# Patient Record
Sex: Male | Born: 1960 | Race: White | Hispanic: No | Marital: Married | Smoking: Former smoker
Health system: Southern US, Community
[De-identification: ages and names within clinical notes are randomized; demographics above are authoritative.]

## PROBLEM LIST (undated history)

## (undated) DIAGNOSIS — R0789 Other chest pain: Secondary | ICD-10-CM

## (undated) DIAGNOSIS — I493 Ventricular premature depolarization: Secondary | ICD-10-CM

## (undated) DIAGNOSIS — Z8661 Personal history of infections of the central nervous system: Secondary | ICD-10-CM

## (undated) DIAGNOSIS — J329 Chronic sinusitis, unspecified: Secondary | ICD-10-CM

## (undated) DIAGNOSIS — E785 Hyperlipidemia, unspecified: Secondary | ICD-10-CM

## (undated) HISTORY — DX: Ventricular premature depolarization: I49.3

## (undated) HISTORY — DX: Hyperlipidemia, unspecified: E78.5

## (undated) HISTORY — DX: Other chest pain: R07.89

## (undated) HISTORY — DX: Chronic sinusitis, unspecified: J32.9

## (undated) HISTORY — DX: Personal history of infections of the central nervous system: Z86.61

---

## 2004-03-13 ENCOUNTER — Ambulatory Visit: Payer: Self-pay | Admitting: Otolaryngology

## 2005-09-16 ENCOUNTER — Observation Stay: Payer: Self-pay | Admitting: Internal Medicine

## 2005-10-02 ENCOUNTER — Ambulatory Visit: Payer: Self-pay | Admitting: Internal Medicine

## 2005-10-10 ENCOUNTER — Ambulatory Visit: Payer: Self-pay

## 2005-10-16 ENCOUNTER — Ambulatory Visit (HOSPITAL_COMMUNITY): Admission: RE | Admit: 2005-10-16 | Discharge: 2005-10-16 | Payer: Self-pay | Admitting: Cardiovascular Disease

## 2005-10-16 ENCOUNTER — Ambulatory Visit: Payer: Self-pay | Admitting: Cardiovascular Disease

## 2005-11-17 ENCOUNTER — Ambulatory Visit: Payer: Self-pay | Admitting: Internal Medicine

## 2008-05-10 ENCOUNTER — Ambulatory Visit: Payer: Self-pay | Admitting: Gastroenterology

## 2010-01-06 HISTORY — PX: NASAL SINUS SURGERY: SHX719

## 2010-01-26 ENCOUNTER — Encounter: Payer: Self-pay | Admitting: Cardiovascular Disease

## 2010-10-07 ENCOUNTER — Ambulatory Visit (INDEPENDENT_AMBULATORY_CARE_PROVIDER_SITE_OTHER): Payer: BC Managed Care – PPO | Admitting: Internal Medicine

## 2010-10-07 ENCOUNTER — Encounter: Payer: Self-pay | Admitting: Internal Medicine

## 2010-10-07 VITALS — BP 131/88 | HR 75 | Ht 76.0 in | Wt 262.0 lb

## 2010-10-07 DIAGNOSIS — I499 Cardiac arrhythmia, unspecified: Secondary | ICD-10-CM

## 2010-10-07 DIAGNOSIS — I493 Ventricular premature depolarization: Secondary | ICD-10-CM | POA: Insufficient documentation

## 2010-10-07 MED ORDER — VERAPAMIL HCL 40 MG PO TABS: ORAL_TABLET | ORAL | Status: DC

## 2010-10-07 NOTE — Patient Instructions (Addendum)
START Verapamil 40mg  every 6-8 hrs AS NEEDED.  Your physician has requested that you have an echocardiogram. Echocardiography is a painless test that uses sound waves to create images of your heart. It provides your doctor with information about the size and shape of your heart and how well your heart's chambers and valves are working. This procedure takes approximately one hour. There are no restrictions for this procedure.  Your physician recommends that you schedule a follow-up appointment in: 1 year

## 2010-10-07 NOTE — Progress Notes (Signed)
HPI Michael Fletcher is a 50 y.o. 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 I have not seen him since 2007. At that time he had undergone an evaluation to exclude ARVC  This included an MR which was notable for mild hypokinesis attributed to tethering from a moderator band and without infiltration; electrocardiogram with normal right ventricular structures and the Myoview scan done [facility] clinic w normal left ventricular perfusion.  At that time we had made recommendations to discontinue soda and caffeine which is associated with a 99+ percent a limitation of his ectopic beats. He has recently had some recurrences. They typically come in short clusters lasting 5-10 minutes. They have not been associated with exercise. There has been no lightheadedness or syncope. Current Outpatient Prescriptions  Medication Sig Dispense Refill  . Melatonin 5 MG TABS Take by mouth.        . naproxen sodium (ANAPROX) 220 MG tablet Take 220 mg by mouth 2 (two) times daily with a meal.          Allergies  Allergen Reactions  . Floxacillin Base   . Statins     Makes him feel crazy.  [name]     Past Medical History  Diagnosis Date  . Hyperlipidemia   . Arrhythmia     Past Surgical History  Procedure Date  . Nasal sinus surgery 01/2010    History reviewed. No pertinent family history.  History   Social History  . Marital Status: Single    Spouse Name: N/A    Number of Children: N/A  . Years of Education: N/A   Occupational History  . Not on file.   Social History Main Topics  . Smoking status: Former Smoker -- 0.0 packs/day for 0 years    Types: Cigarettes  . Smokeless tobacco: Not on file  . Alcohol Use: No  . Drug Use: No  . Sexually Active: Not on file   Other Topics Concern  . Not on file   Social History Narrative  . No narrative on file    Fourteen point review of systems was negative except as noted in HPI and PMH    PHYSICAL EXAMINATION  Blood pressure 131/88, pulse 75, height 6\' 4"  (1.93 m), weight 262 lb (118.842 kg).   Well developed and nourished in no acute distress HENT normal Neck supple with JVP-flat Carotids brisk and full without bruits Back without scoliosis or kyphosis Clear Regular rate and rhythm, no murmurs or gallops Abd-soft with active BS without hepatomegaly or midline pulsation Femoral pulses 2+ distal pulses intact No Clubbing cyanosis edema Skin-warm and dry LN-neg submandibular and supraclavicular A & Oriented CN 3-12 normal  Grossly normal sensory and motor function Affect engaging  Electrocardiogram dated today demonstrates sinus rhythm at 67  intervals 0.18/0.08/0.37 Axis is 34 T Wave morphologies V2-V4 were normal.

## 2010-10-07 NOTE — Assessment & Plan Note (Signed)
The patient is having some recurrences of his ventricular ectopy. We will re\re prescribe verapamil to be taken on an as-needed basis. Given his slight abnormalities noted on his MRI 5 years ago, we will repeat an echo looking at right ventricular structures, specifically sizes

## 2010-10-22 ENCOUNTER — Other Ambulatory Visit (INDEPENDENT_AMBULATORY_CARE_PROVIDER_SITE_OTHER): Payer: BC Managed Care – PPO | Admitting: *Deleted

## 2010-10-22 DIAGNOSIS — I379 Nonrheumatic pulmonary valve disorder, unspecified: Secondary | ICD-10-CM

## 2010-10-22 DIAGNOSIS — I499 Cardiac arrhythmia, unspecified: Secondary | ICD-10-CM

## 2010-10-22 DIAGNOSIS — R002 Palpitations: Secondary | ICD-10-CM

## 2012-09-13 ENCOUNTER — Ambulatory Visit: Payer: BC Managed Care – PPO | Admitting: Internal Medicine

## 2012-09-22 ENCOUNTER — Encounter: Payer: Self-pay | Admitting: Internal Medicine

## 2012-09-22 ENCOUNTER — Ambulatory Visit (INDEPENDENT_AMBULATORY_CARE_PROVIDER_SITE_OTHER): Payer: BC Managed Care – PPO | Admitting: Internal Medicine

## 2012-09-22 VITALS — BP 120/84 | HR 69 | Ht 76.0 in | Wt 273.5 lb

## 2012-09-22 DIAGNOSIS — I493 Ventricular premature depolarization: Secondary | ICD-10-CM

## 2012-09-22 DIAGNOSIS — I499 Cardiac arrhythmia, unspecified: Secondary | ICD-10-CM

## 2012-09-22 DIAGNOSIS — I4949 Other premature depolarization: Secondary | ICD-10-CM

## 2012-09-22 MED ORDER — PROPRANOLOL HCL 10 MG PO TABS: 10.0000 mg | ORAL_TABLET | ORAL | Status: DC | PRN

## 2012-09-22 NOTE — Patient Instructions (Addendum)
Your physician has recommended you make the following change in your medication:  1) Stop Verapamil 2) Start Propanolol 10 mg as needed  Your physician wants you to follow-up in: one year with Dr. Graciela Husbands. You will receive a reminder letter in the mail two months in advance. If you don't receive a letter, please call our office to schedule the follow-up appointment.

## 2012-09-22 NOTE — Progress Notes (Signed)
Patient Care Team: Provider Not In System as PCP - General   HPI  Michael Fletcher is a 52 y.o. male Seen in followup for PVCs with RVOT morphology. He was seen initially in 2007. I saw him again in 2012. MRI 2007 showed mild hypokinesis. Echocardiogram demonstrated normal right-sided structures 2012  We have no documentation in the current computer systems of his PVC morphology.  He recently had an episode where they were more frequent. This may have been related to stress. He notes an association between PVCs and caffeine; they seemed to be suppressed with exercise  Past Medical History  Diagnosis Date  . Hyperlipidemia   . Arrhythmia     Past Surgical History  Procedure Laterality Date  . Nasal sinus surgery  01/2010    Current Outpatient Prescriptions  Medication Sig Dispense Refill  . verapamil (CALAN) 40 MG tablet 1 tablet every 6-8 hours PRN  30 tablet  3   No current facility-administered medications for this visit.    Allergies  Allergen Reactions  . Floxacillin Base   . Statins     Makes him feel crazy.  . Sulfur     Review of Systems negative except from HPI and PMH  Physical Exam BP 120/84  Pulse 69  Ht 6\' 4"  (1.93 m)  Wt 273 lb 8 oz (124.059 kg)  BMI 33.31 kg/m2 Well developed and nourished in no acute distress HENT normal Neck supple with JVP-flat Clear Regular rate and rhythm, no murmurs or gallops Abd-soft with active BS No Clubbing cyanosis edema Skin-warm and dry A & Oriented  Grossly normal sensory and motor function y ECG demonstrates sinus rhythm at 69 intervals 16/09/37 Q waves are normal in V2 and V3 Terminal activation time in V1 is long   Assessment and  Plan

## 2012-09-22 NOTE — Assessment & Plan Note (Addendum)
He continues to have some PVCs. We will change his verapamil to when necessary propranolol.  His ECG demonstrates a long TAD of about 60 ms. There are no other morphologic suggestions of arrhythmogenic cardiomyopathy  Reason studies is suggested a treadmill testing can often bring out repolarization and depolarization abnormalities; further studies have suggested electrical abnormalities precede structural abnormalities

## 2012-09-30 ENCOUNTER — Encounter: Payer: Self-pay | Admitting: *Deleted

## 2013-09-27 ENCOUNTER — Encounter: Payer: Self-pay | Admitting: Internal Medicine

## 2013-09-27 ENCOUNTER — Ambulatory Visit (INDEPENDENT_AMBULATORY_CARE_PROVIDER_SITE_OTHER): Payer: BC Managed Care – PPO | Admitting: Internal Medicine

## 2013-09-27 VITALS — BP 126/78 | HR 60 | Ht 76.0 in | Wt 273.0 lb

## 2013-09-27 DIAGNOSIS — I4949 Other premature depolarization: Secondary | ICD-10-CM

## 2013-09-27 DIAGNOSIS — I493 Ventricular premature depolarization: Secondary | ICD-10-CM

## 2013-09-27 NOTE — Progress Notes (Signed)
Patient Care Team: Titus Mould, NP as PCP - General (Family Medicine)   HPI  Michael Fletcher is a 53 y.o. male Seen in followup for PVCs with RVOT morphology. He was seen initially in 2007. I saw him again in 2012. MRI 2007 showed mild hypokinesis. Echocardiogram demonstrated normal right-sided structures 2012  We have no documentation in the current computer systems of his PVC morphology.  He has had no intercurrent palpitations. He has no problems with exercise tolerance.     Past Medical History  Diagnosis Date  . Hyperlipidemia   . Arrhythmia   . Chronic sinusitis   . Arrhythmia   . Hx: meningitis     Past Surgical History  Procedure Laterality Date  . Nasal sinus surgery  01/2010    Current Outpatient Prescriptions  Medication Sig Dispense Refill  . fluticasone (FLONASE) 50 MCG/ACT nasal spray 2 sprays in each nostril twice a day      . propranolol (INDERAL) 10 MG tablet Take 1 tablet (10 mg total) by mouth as needed.  30 tablet  3   No current facility-administered medications for this visit.    Allergies  Allergen Reactions  . Floxacillin Base   . Statins     Makes him feel crazy.  . 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 The patient is clinically stable. We will see him in 2 years. We will get an echocardiogram at that time.  Largely  quiescent. We will see him again in 2 year's time

## 2013-09-27 NOTE — Patient Instructions (Signed)
Your physician wants you to follow-up in:  2 years. You will receive a reminder letter in the mail two months in advance. If you don't receive a letter, please call our office to schedule the follow-up appointment.   

## 2014-03-20 ENCOUNTER — Emergency Department: Payer: Self-pay | Admitting: Emergency Medicine

## 2015-04-16 ENCOUNTER — Telehealth: Payer: Self-pay | Admitting: Internal Medicine

## 2015-04-16 MED ORDER — PROPRANOLOL HCL 10 MG PO TABS: 10.0000 mg | ORAL_TABLET | ORAL | Status: DC | PRN

## 2015-04-16 NOTE — Telephone Encounter (Signed)
I called and spoke with the patient. He states that over the last 1-2 weeks, he has been having an increase in heart palpitations/ heart skipping. He has been out of his PRN propranolol for about the last 2 months. He denies any other related symptoms to his heart palpitations.  A refill for his propranolol will be sent in. I have advised the patient to start back on propranolol 10 mg once daily as needed. He will call back in a week to let us know how he is doing. If no improvement in symptoms, I advised him he may need to wear a holter monitor, but will review with MD at that time. He is agreeable.

## 2015-04-16 NOTE — Telephone Encounter (Signed)
Pt calling stating he is having irregular heart beat for about now a week or 2 No other symptoms.  Please advise   *STAT* If patient is at the pharmacy, call can be transferred to refill team.   1. Which medications need to be refilled? (please list name of each medication and dose if known) Propranolol    2. Which pharmacy/location (including street and city if local pharmacy) is medication to be sent to? [facility] in [place]   3. Do they need a 30 day or 90 day supply? 90 day

## 2015-07-11 ENCOUNTER — Emergency Department
Admission: EM | Admit: 2015-07-11 | Discharge: 2015-07-11 | Disposition: A | Discharge status: Home / Self Care | Payer: BLUE CROSS/BLUE SHIELD | Attending: Emergency Medicine | Admitting: Emergency Medicine

## 2015-07-11 ENCOUNTER — Emergency Department: Payer: BLUE CROSS/BLUE SHIELD

## 2015-07-11 ENCOUNTER — Encounter: Payer: Self-pay | Admitting: *Deleted

## 2015-07-11 DIAGNOSIS — Z79899 Other long term (current) drug therapy: Secondary | ICD-10-CM | POA: Insufficient documentation

## 2015-07-11 DIAGNOSIS — R0789 Other chest pain: Secondary | ICD-10-CM | POA: Insufficient documentation

## 2015-07-11 DIAGNOSIS — Z87891 Personal history of nicotine dependence: Secondary | ICD-10-CM | POA: Diagnosis not present

## 2015-07-11 DIAGNOSIS — E785 Hyperlipidemia, unspecified: Secondary | ICD-10-CM | POA: Diagnosis not present

## 2015-07-11 DIAGNOSIS — Z7951 Long term (current) use of inhaled steroids: Secondary | ICD-10-CM | POA: Insufficient documentation

## 2015-07-11 DIAGNOSIS — R079 Chest pain, unspecified: Secondary | ICD-10-CM

## 2015-07-11 LAB — CBC
HEMATOCRIT: 44.8 % (ref 40.0–52.0)
HEMOGLOBIN: 15.6 g/dL (ref 13.0–18.0)
MCH: 31.6 pg (ref 26.0–34.0)
MCHC: 34.7 g/dL (ref 32.0–36.0)
MCV: 90.9 fL (ref 80.0–100.0)
Platelets: 245 10*3/uL (ref 150–440)
RBC: 4.93 MIL/uL (ref 4.40–5.90)
RDW: 13.5 % (ref 11.5–14.5)
WBC: 10.8 10*3/uL — AB (ref 3.8–10.6)

## 2015-07-11 LAB — BASIC METABOLIC PANEL
Anion gap: 8 (ref 5–15)
BUN: 15 mg/dL (ref 6–20)
CHLORIDE: 104 mmol/L (ref 101–111)
CO2: 26 mmol/L (ref 22–32)
Calcium: 9.4 mg/dL (ref 8.9–10.3)
Creatinine, Ser: 1.03 mg/dL (ref 0.61–1.24)
GFR calc non Af Amer: >60 mL/min (ref >60)
Glucose, Bld: 80 mg/dL (ref 65–99)
POTASSIUM: 4 mmol/L (ref 3.5–5.1)
SODIUM: 138 mmol/L (ref 135–145)

## 2015-07-11 LAB — TROPONIN I
Troponin I: <0.03 ng/mL (ref <0.03)
Troponin I: <0.03 ng/mL (ref <0.03)

## 2015-07-11 MED ORDER — ASPIRIN 81 MG PO CHEW
324.0000 mg | CHEWABLE_TABLET | Freq: Once | ORAL | Status: AC
  Administered 2015-07-11: 324 mg via ORAL

## 2015-07-11 NOTE — ED Notes (Signed)
Pt denies chest pain at time of assessment pt reports tingling to left arm

## 2015-07-11 NOTE — Discharge Instructions (Signed)
Nonspecific Chest Pain  °Chest pain can be caused by many different conditions. There is always a chance that your pain could be related to something serious, such as a heart attack or a blood clot in your lungs. Chest pain can also be caused by conditions that are not life-threatening. If you have chest pain, it is very important to follow up with your health care provider. °CAUSES  °Chest pain can be caused by: °· Heartburn. °· Pneumonia or bronchitis. °· Anxiety or stress. °· Inflammation around your heart (pericarditis) or lung (pleuritis or pleurisy). °· A blood clot in your lung. °· A collapsed lung (pneumothorax). It can develop suddenly on its own (spontaneous pneumothorax) or from trauma to the chest. °· Shingles infection (varicella-zoster virus). °· Heart attack. °· Damage to the bones, muscles, and cartilage that make up your chest wall. This can include: °¨ Bruised bones due to injury. °¨ Strained muscles or cartilage due to frequent or repeated coughing or overwork. °¨ Fracture to one or more ribs. °¨ Sore cartilage due to inflammation (costochondritis). °RISK FACTORS  °Risk factors for chest pain may include: °· Activities that increase your risk for trauma or injury to your chest. °· Respiratory infections or conditions that cause frequent coughing. °· Medical conditions or overeating that can cause heartburn. °· Heart disease or family history of heart disease. °· Conditions or health behaviors that increase your risk of developing a blood clot. °· Having had chicken pox (varicella zoster). °SIGNS AND SYMPTOMS °Chest pain can feel like: °· Burning or tingling on the surface of your chest or deep in your chest. °· Crushing, pressure, aching, or squeezing pain. °· Dull or sharp pain that is worse when you move, cough, or take a deep breath. °· Pain that is also felt in your back, neck, shoulder, or arm, or pain that spreads to any of these areas. °Your chest pain may come and go, or it may stay  constant. °DIAGNOSIS °Lab tests or other studies may be needed to find the cause of your pain. Your health care provider may have you take a test called an ambulatory ECG (electrocardiogram). An ECG records your heartbeat patterns at the time the test is performed. You may also have other tests, such as: °· Transthoracic echocardiogram (TTE). During echocardiography, sound waves are used to create a picture of all of the heart structures and to look at how blood flows through your heart. °· Transesophageal echocardiogram (TEE). This is a more advanced imaging test that obtains images from inside your body. It allows your health care provider to see your heart in finer detail. °· Cardiac monitoring. This allows your health care provider to monitor your heart rate and rhythm in real time. °· Holter monitor. This is a portable device that records your heartbeat and can help to diagnose abnormal heartbeats. It allows your health care provider to track your heart activity for several days, if needed. °· Stress tests. These can be done through exercise or by taking medicine that makes your heart beat more quickly. °· Blood tests. °· Imaging tests. °TREATMENT  °Your treatment depends on what is causing your chest pain. Treatment may include: °· Medicines. These may include: °¨ Acid blockers for heartburn. °¨ Anti-inflammatory medicine. °¨ Pain medicine for inflammatory conditions. °¨ Antibiotic medicine, if an infection is present. °¨ Medicines to dissolve blood clots. °¨ Medicines to treat coronary artery disease. °· Supportive care for conditions that do not require medicines. This may include: °¨ Resting. °¨ Applying heat   or cold packs to injured areas. °¨ Limiting activities until pain decreases. °HOME CARE INSTRUCTIONS °· If you were prescribed an antibiotic medicine, finish it all even if you start to feel better. °· Avoid any activities that bring on chest pain. °· Do not use any tobacco products, including  cigarettes, chewing tobacco, or electronic cigarettes. If you need help quitting, ask your health care provider. °· Do not drink alcohol. °· Take medicines only as directed by your health care provider. °· Keep all follow-up visits as directed by your health care provider. This is important. This includes any further testing if your chest pain does not go away. °· If heartburn is the cause for your chest pain, you may be told to keep your head raised (elevated) while sleeping. This reduces the chance that acid will go from your stomach into your esophagus. °· Make lifestyle changes as directed by your health care provider. These may include: °¨ Getting regular exercise. Ask your health care provider to suggest some activities that are safe for you. °¨ Eating a heart-healthy diet. A registered dietitian can help you to learn healthy eating options. °¨ Maintaining a healthy weight. °¨ Managing diabetes, if necessary. °¨ Reducing stress. °SEEK MEDICAL CARE IF: °· Your chest pain does not go away after treatment. °· You have a rash with blisters on your chest. °· You have a fever. °SEEK IMMEDIATE MEDICAL CARE IF:  °· Your chest pain is worse. °· You have an increasing cough, or you cough up blood. °· You have severe abdominal pain. °· You have severe weakness. °· You faint. °· You have chills. °· You have sudden, unexplained chest discomfort. °· You have sudden, unexplained discomfort in your arms, back, neck, or jaw. °· You have shortness of breath at any time. °· You suddenly start to sweat, or your skin gets clammy. °· You feel nauseous or you vomit. °· You suddenly feel light-headed or dizzy. °· Your heart begins to beat quickly, or it feels like it is skipping beats. °These symptoms may represent a serious problem that is an emergency. Do not wait to see if the symptoms will go away. Get medical help right away. Call your local emergency services (911 in the [place] Do not drive yourself to the hospital. °  °This  information is not intended to replace advice given to you by your health care provider. Make sure you discuss any questions you have with your health care provider. °  °Document Released: 10/02/2004 Document Revised: 01/13/2014 Document Reviewed: 07/29/2013 °Elsevier Interactive Patient Education ©2016 [employer] ° °

## 2015-07-11 NOTE — ED Provider Notes (Signed)
[facility] Emergency Department Provider Note        Time seen: ----------------------------------------- 6:23 PM on 07/11/2015 -----------------------------------------    I have reviewed the triage vital signs and the nursing notes.   HISTORY  Chief Complaint Chest Pain    HPI Michael LemmingsWouter Fletcher is a 55 y.o. male who presents to ER for chest pain and tingling in his left arm. Patient states chest pressure on the left side of his chest, began this morning. He has never had a history of this before, denies sweats, nausea, shortness of breath. Patient reports she does have a history of arrhythmia. He states he does have a history of high cholesterol but cannot tolerate statins.   Past Medical History  Diagnosis Date  . Hyperlipidemia   . Arrhythmia   . Chronic sinusitis   . Arrhythmia   . Hx: meningitis     Patient Active Problem List   Diagnosis Date Noted  . 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 Nasal sinus surgery  01/2010    Allergies Floxacillin base; Statins; and Sulfur  Social History Social History  Substance Use Topics  . Smoking status: Former Smoker -- 0.00 packs/day for 0 years    Types: Cigarettes  . Smokeless tobacco: None  . Alcohol Use: Yes     Comment: occassional    Review of Systems Constitutional: Negative for fever. Eyes: Negative for visual changes. ENT: Negative for sore throat. Cardiovascular: Positive for chest pain Respiratory: Negative for shortness of breath. Gastrointestinal: Negative for abdominal pain, vomiting and diarrhea. Genitourinary: Negative for dysuria. Musculoskeletal: Negative for back pain. Skin: Negative for rash. Neurological: Negative for headaches, positive for tingling in the left arm  10-point ROS otherwise negative.  ____________________________________________   PHYSICAL EXAM:  VITAL SIGNS: ED Triage Vitals  Enc Vitals Group      BP 07/11/15 1658 141/86 mmHg     Pulse Rate 07/11/15 1658 57     Resp 07/11/15 1658 18     Temp 07/11/15 1658 98.5 F (36.9 C)     Temp Source 07/11/15 1658 Oral     SpO2 07/11/15 1658 96 %     Weight 07/11/15 1658 260 lb (117.935 kg)     Height 07/11/15 1658 6\' 4"  (1.93 m)     Head Cir --      Peak Flow --      Pain Score 07/11/15 1658 3     Pain Loc --      Pain Edu? --      Excl. in GC? --     Constitutional: Alert and oriented. Well appearing and in no distress. Eyes: Conjunctivae are normal. PERRL. Normal extraocular movements. ENT   Head: Normocephalic and atraumatic.   Nose: No congestion/rhinnorhea.   Mouth/Throat: Mucous membranes are moist.   Neck: No stridor. Cardiovascular: Normal rate, regular rhythm. No murmurs, rubs, or gallops. Respiratory: Normal respiratory effort without tachypnea nor retractions. Breath sounds are clear and equal bilaterally. No wheezes/rales/rhonchi. Gastrointestinal: Soft and nontender. Normal bowel sounds Musculoskeletal: Nontender with normal range of motion in all extremities. No lower extremity tenderness nor edema. Neurologic:  Normal speech and language. No gross focal neurologic deficits are appreciated.  Skin:  Skin is warm, dry and intact. No rash noted. Psychiatric: Mood and affect are normal. Speech and behavior are normal.  ____________________________________________  EKG: Interpreted by me. Sinus rhythm rate of 60 bpm, normal PR interval, normal QRS size, normal QT  interval. Normal axis. Possible anterior infarct age indeterminate  ____________________________________________  ED COURSE:  Pertinent labs & imaging results that were available during my care of the patient were reviewed by me and considered in my medical decision making (see chart for details). Patient is in no distress, very pleasant. I will check cardiac labs and imaging. ____________________________________________    LABS (pertinent  positives/negatives)  Labs Reviewed  CBC - Abnormal; Notable for the following:    WBC 10.8 (*)    All other components within normal limits  BASIC METABOLIC PANEL  TROPONIN I  TROPONIN I    RADIOLOGY  Chest x-ray Is unremarkable ____________________________________________  FINAL ASSESSMENT AND PLAN  Chest pain  Plan: Patient with labs and imaging as dictated above. Patient with negative labs and serial troponin which are unremarkable. He'll be referred for cardiology follow-up in 24 hours.   Emily FilbertWilliams, Jonathan E, MD   Note: This dictation was prepared with Dragon dictation. Any transcriptional errors that result from this process are unintentional   Emily FilbertJonathan E Williams, MD 07/11/15 [phone]

## 2015-07-11 NOTE — ED Notes (Signed)
States mid chest pressure that began this AM, states it began in his left arm and radiates to his chest, denies any SOB, states hx of an arrythemia

## 2015-07-12 ENCOUNTER — Ambulatory Visit (INDEPENDENT_AMBULATORY_CARE_PROVIDER_SITE_OTHER): Payer: BLUE CROSS/BLUE SHIELD | Admitting: Nurse Practitioner

## 2015-07-12 ENCOUNTER — Encounter: Payer: Self-pay | Admitting: Nurse Practitioner

## 2015-07-12 VITALS — BP 122/90 | HR 70 | Ht 76.0 in | Wt 267.4 lb

## 2015-07-12 DIAGNOSIS — I493 Ventricular premature depolarization: Secondary | ICD-10-CM

## 2015-07-12 DIAGNOSIS — R0789 Other chest pain: Secondary | ICD-10-CM

## 2015-07-12 DIAGNOSIS — E785 Hyperlipidemia, unspecified: Secondary | ICD-10-CM | POA: Insufficient documentation

## 2015-07-12 NOTE — Patient Instructions (Addendum)
Medication Instructions:  Please continue your current medications  Labwork: None  Testing/Procedures: [facility]  Your caregiver has ordered a Stress Test with nuclear imaging. The purpose of this test is to evaluate the blood supply to your heart muscle. This procedure is referred to as a "Non-Invasive Stress Test." This is because other than having an IV started in your vein, nothing is inserted or "invades" your body. Cardiac stress tests are done to find areas of poor blood flow to the heart by determining the extent of coronary artery disease (CAD). Some patients exercise on a treadmill, which naturally increases the blood flow to your heart, while others who are  unable to walk on a treadmill due to physical limitations have a pharmacologic/chemical stress agent called Lexiscan . This medicine will mimic walking on a treadmill by temporarily increasing your coronary blood flow.   Please note: these test may take anywhere between 2-4 hours to complete  PLEASE REPORT TO [facility]TR[facility]  THE VOLUNTEERS AT THE FIRST DESK WILL DIRECT YOU WHERE TO GO  Date of Procedure:_____Monday, July 10__________  Arrival Time for Procedure:_____7:45 am______________  Instructions regarding medication:   __X__:  Hold PROPRANOLOL the night before and morning of procedure    Do not eat or drink after midnight  No caffeine for 24 hours prior to test  No smoking 24 hours prior to test.  Your medication may be taken with water.  If your doctor stopped a medication because of this test, do not take that medication.  Ladies, please do not wear dresses.  Skirts or pants are appropriate. Please wear a short sleeve shirt.  No perfume, cologne or lotion.  Wear comfortable walking shoes. No heels!  Please stop at the front desk and move up your ECHO appt so a sooner time  Date & time:________________________   Follow-Up: Please keep your scheduled appt w/ Dr. Graciela HusbandsKlein  If you need a  refill on your cardiac medications before your next appointment, please call your pharmacy.  Cardiac Nuclear Scanning A cardiac nuclear scan is used to check your heart for problems, such as the following:  A portion of the heart is not getting enough blood.  Part of the heart muscle has died, which happens with a heart attack.  The heart wall is not working normally.  In this test, a radioactive dye (tracer) is injected into your bloodstream. After the tracer has traveled to your heart, a scanning device is used to measure how much of the tracer is absorbed by or distributed to various areas of your heart. LET [facility] PROVIDER KNOW ABOUT:  Any allergies you have.  All medicines you are taking, including vitamins, herbs, eye drops, creams, and over-the-counter medicines.  Previous problems you or members of your family have had with the use of anesthetics.  Any blood disorders you have.  Previous surgeries you have had.  Medical conditions you have.  RISKS AND COMPLICATIONS Generally, this is a safe procedure. However, as with any procedure, problems can occur. Possible problems include:   Serious chest pain.  Rapid heartbeat.  Sensation of warmth in your chest. This usually passes quickly. BEFORE THE PROCEDURE Ask your health care provider about changing or stopping your regular medicines. PROCEDURE This procedure is usually done at a hospital and takes 2-4 hours.  An IV tube is inserted into one of your veins.  Your health care provider will inject a small amount of radioactive tracer through the tube.  You will  then wait for 20-40 minutes while the tracer travels through your bloodstream.  You will lie down on an exam table so images of your heart can be taken. Images will be taken for about 15-20 minutes.  You will exercise on a treadmill or stationary bike. While you exercise, your heart activity will be monitored with an electrocardiogram (ECG), and your  blood pressure will be checked.  If you are unable to exercise, you may be given a medicine to make your heart beat faster.  When blood flow to your heart has peaked, tracer will again be injected through the IV tube.  After 20-40 minutes, you will get back on the exam table and have more images taken of your heart.  When the procedure is over, your IV tube will be removed. AFTER THE PROCEDURE  You will likely be able to leave shortly after the test. Unless your health care provider tells you otherwise, you may return to your normal schedule, including diet, activities, and medicines.  Make sure you find out how and when you will get your test results.   This information is not intended to replace advice given to you by your health care provider. Make sure you discuss any questions you have with your health care provider.   Document Released: 01/18/2004 Document Revised: 12/28/2012 Document Reviewed: 12/01/2012 Elsevier Interactive Patient Education [employer] Echocardiogram An echocardiogram, or echocardiography, uses sound waves (ultrasound) to produce an image of your heart. The echocardiogram is simple, painless, obtained within a short period of time, and offers valuable information to your health care provider. The images from an echocardiogram can provide information such as:  Evidence of coronary artery disease (CAD).  Heart size.  Heart muscle function.  Heart valve function.  Aneurysm detection.  Evidence of a past heart attack.  Fluid buildup around the heart.  Heart muscle thickening.  Assess heart valve function. LET [facility] PROVIDER KNOW ABOUT:  Any allergies you have.  All medicines you are taking, including vitamins, herbs, eye drops, creams, and over-the-counter medicines.  Previous problems you or members of your family have had with the use of anesthetics.  Any blood disorders you have.  Previous surgeries you have had.  Medical  conditions you have.  Possibility of pregnancy, if this applies. BEFORE THE PROCEDURE  No special preparation is needed. Eat and drink normally.  PROCEDURE   In order to produce an image of your heart, gel will be applied to your chest and a wand-like tool (transducer) will be moved over your chest. The gel will help transmit the sound waves from the transducer. The sound waves will harmlessly bounce off your heart to allow the heart images to be captured in real-time motion. These images will then be recorded.  You may need an IV to receive a medicine that improves the quality of the pictures. AFTER THE PROCEDURE You may return to your normal schedule including diet, activities, and medicines, unless your health care provider tells you otherwise.   This information is not intended to replace advice given to you by your health care provider. Make sure you discuss any questions you have with your health care provider.   Document Released: 12/21/1999 Document Revised: 01/13/2014 Document Reviewed: 08/30/2012 Elsevier Interactive Patient Education [employer] Elsevier Inc.

## 2015-07-12 NOTE — Progress Notes (Signed)
Office Visit    Patient Name: Michael NewtonWouter De Ligt Date of Encounter: 07/12/2015  Primary Care Provider:  WHITE, Arlyss RepressELIZABETH BURNEY, NP Primary Cardiologist:  Odessa FlemingS. 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  Past Medical History    Past Medical History  Diagnosis Date  . Hyperlipidemia   . Chronic sinusitis   . PVC's (premature ventricular contractions)     a. RVOT Morphology;  b. 10/2005 cMRI: EF 60%, mild basal and mid RV wall HK - possibly 2/2 tethering from prominent moderator band, no fatty infiltration making RV dysplasia less likely, nl atrial sizes;  c. 10/2010 Echo: EF 55-60%, no rwma, midlly dil Ao root.  Marland Kitchen. Hx: meningitis   . Atypical chest pain     a. 07/2015 - ED visit.   Past Surgical History  Procedure Laterality Date  . Nasal sinus surgery  01/2010    Allergies  Allergies  Allergen Reactions  . Floxacillin Base   . Statins     Makes him feel crazy.  Marland Kitchen. 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 He has previously been followed by Dr. Graciela HusbandsKlein. He has normal LV function by echocardiogram in 2012.  Also identified he was also evaluated in 2007 with cardiac MRI and stress testing, which was normal. He was last seen in clinic in 2015. Since then, he has done well. He works out frequently, sometimes twice a day. He is not particularly bothered by PVCs. He was in his usual state of health until July 5, when he was sitting at his desk and noted tingling of his left arm. This was less a sensation of pins and needles and more a sensation of shakiness, described as "when you drink too much coffee." At some point later in the day, he also noted left-sided chest pressure, prompting him present to the ED. There, ECG was nonacute and troponins were normal. Symptoms resolved after taking aspirin. He was advised to follow-up with cardiology.  He has had no recurrence of symptoms.    Prior to Admission medications   Medication Sig Start Date End Date Taking? Authorizing Provider  fluticasone (FLONASE) 50 MCG/ACT nasal spray 2 sprays in each nostril twice a day 11/15/09  Yes Historical Provider, MD  propranolol (INDERAL) 10 MG tablet Take 1 tablet (10 mg total) by mouth as needed. 04/16/15  Yes Duke SalviaSteven C Klein, MD    Review of Systems   Left arm tingling and left-sided chest pressure occurring yesterday. Otherwise all other systems reviewed and are otherwise negative except as noted above.  Physical Exam    VS:  BP 122/90 mmHg  Pulse 70  Ht 6\' 4"  (1.93 m)  Wt 267 lb 6.4 oz (121.292 kg)  BMI 32.56 kg/m2 , BMI Body mass index is 32.56 kg/(m^2). GEN: Well nourished, well developed, in no acute distress. HEENT: normal. Neck: Supple, no JVD, carotid bruits, or masses. Cardiac: RRR, no murmurs, rubs, or gallops. No clubbing, cyanosis, edema.  Radials/DP/PT 2+ and equal bilaterally.  Respiratory:  Respirations regular and unlabored, clear to auscultation bilaterally. GI: Soft, nontender, nondistended, BS + x 4. MS: no deformity or atrophy. Skin: warm and dry, no rash. Neuro:  Strength and sensation are intact. Psych: Normal affect.  Accessory Clinical Findings    ECG - from  ER on July 5, sinus rhythm, 60, leftward axis, no acute ST or T changes.   Assessment & Plan   1. Left-sided chest pressure: Patient experienced left arm tingling for much of the day on July 5 and this was followed by left-sided chest pressure. He presented to the ED. ECG was nonacute. Troponins were normal. Symptoms resolved after aspirin. Symptoms are somewhat atypical, lasting for several hours without objective evidence of ischemia. I will arrange for an exercise Myoview to rule out ischemia. He is agreeable to this plan.  2. History of RVOT PVCs: He is scheduled to follow-up with Dr. Graciela HusbandsKlein in September. I will arrange for an echocardiogram as this was  planned to be done this year. He has been doing well from the standpoint of PVCs and has not required propranolol often.  3. Disposition: Follow-up Myoview. Follow-up echo with history of PVCs. Follow with Dr. Graciela HusbandsKlein as scheduled in September or sooner pending testing.  Nicolasa Duckinghristopher Sheryl Saintil, NP 07/12/2015, 9:44 AM

## 2015-07-16 ENCOUNTER — Encounter
Admission: RE | Admit: 2015-07-16 | Discharge: 2015-07-16 | Disposition: A | Discharge status: Home / Self Care | Payer: BLUE CROSS/BLUE SHIELD | Source: Ambulatory Visit | Attending: Nurse Practitioner | Admitting: Nurse Practitioner

## 2015-07-16 DIAGNOSIS — R0789 Other chest pain: Secondary | ICD-10-CM | POA: Diagnosis not present

## 2015-07-16 DIAGNOSIS — I493 Ventricular premature depolarization: Secondary | ICD-10-CM | POA: Diagnosis present

## 2015-07-16 LAB — NM MYOCAR MULTI W/SPECT W/WALL MOTION / EF
CHL CUP MPHR: 166 {beats}/min
CHL CUP NUCLEAR SDS: 0
CHL CUP NUCLEAR SRS: 0
CHL CUP RESTING HR STRESS: 74 {beats}/min
CSEPED: 10 min
CSEPEDS: 1 s
Estimated workload: 10.1 METS
LV sys vol: 34 mL
LVDIAVOL: 98 mL (ref 62–150)
Peak HR: 146 {beats}/min
Percent HR: 87 %
SSS: 0
TID: 0.96

## 2015-07-16 MED ORDER — TECHNETIUM TC 99M TETROFOSMIN IV KIT
30.0000 | PACK | Freq: Once | INTRAVENOUS | Status: AC | PRN
  Administered 2015-07-16: 32.471 via INTRAVENOUS

## 2015-07-16 MED ORDER — TECHNETIUM TC 99M TETROFOSMIN IV KIT
13.0100 | PACK | Freq: Once | INTRAVENOUS | Status: AC | PRN
  Administered 2015-07-16: 13.01 via INTRAVENOUS

## 2015-07-17 ENCOUNTER — Other Ambulatory Visit: Payer: Self-pay | Admitting: Nurse Practitioner

## 2015-07-17 DIAGNOSIS — R079 Chest pain, unspecified: Secondary | ICD-10-CM

## 2015-07-25 ENCOUNTER — Other Ambulatory Visit: Payer: BLUE CROSS/BLUE SHIELD

## 2015-07-31 ENCOUNTER — Other Ambulatory Visit: Payer: Self-pay

## 2015-07-31 ENCOUNTER — Ambulatory Visit (INDEPENDENT_AMBULATORY_CARE_PROVIDER_SITE_OTHER): Payer: BLUE CROSS/BLUE SHIELD

## 2015-07-31 DIAGNOSIS — R079 Chest pain, unspecified: Secondary | ICD-10-CM

## 2015-09-20 ENCOUNTER — Other Ambulatory Visit: Payer: Self-pay

## 2015-09-27 ENCOUNTER — Encounter: Payer: Self-pay | Admitting: Internal Medicine

## 2015-09-27 ENCOUNTER — Ambulatory Visit (INDEPENDENT_AMBULATORY_CARE_PROVIDER_SITE_OTHER): Payer: BLUE CROSS/BLUE SHIELD | Admitting: Internal Medicine

## 2015-09-27 VITALS — BP 118/72 | HR 68 | Ht 76.0 in | Wt 269.5 lb

## 2015-09-27 DIAGNOSIS — I493 Ventricular premature depolarization: Secondary | ICD-10-CM | POA: Diagnosis not present

## 2015-09-27 DIAGNOSIS — I429 Cardiomyopathy, unspecified: Secondary | ICD-10-CM | POA: Diagnosis not present

## 2015-09-27 NOTE — Progress Notes (Signed)
Patient Care Team: Titus MouldElizabeth Burney White, NP as PCP - General (Family Medicine)   HPI  Michael Fletcher is a 55 y.o. male Seen in followup for PVCs with RVOT morphology. He was seen initially in 2007. I saw him again in 2012. MRI 2007 showed mild hypokinesis. Echocardiogram demonstrated normal right-sided structures 2012   We have no documentation in the current computer systems of his PVC morphology.  He has had few intercurrent palpitations. He has no problems with exercise tolerance.  He has had some problems with lightheadedness particularly with squatting and bending over. This is been worse on hot days outside.  Seen in ER 7/17 for chest pain and was submitted for  7/17 stress myoview-normal capacity and no ischemia;   7/17 Echo  Normal LV function and mild LVH    Past Medical History:  Diagnosis Date  . Atypical chest pain    a. 07/2015 - ED visit.  . Chronic sinusitis   . Hx: meningitis   . Hyperlipidemia   . 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 10/2005 cMRI: EF 60%, mild basal and mid RV wall HK - possibly 2/2 tethering from prominent moderator band, no fatty infiltration making RV dysplasia less likely, nl atrial sizes;  c. 10/2010 Echo: EF 55-60%, no rwma, midlly dil Ao root.    Past Surgical History:  Procedure Laterality Date  . NASAL SINUS SURGERY  01/2010    Current Outpatient Prescriptions  Medication Sig Dispense Refill  . fluticasone (FLONASE) 50 MCG/ACT nasal spray 2 sprays in each nostril twice a day    . propranolol (INDERAL) 10 MG tablet Take 1 tablet (10 mg total) by mouth as needed. 90 tablet 0  . tamsulosin (FLOMAX) 0.4 MG CAPS capsule TK ONE C PO QD 1/2 HOUR AFTER A MEAL  2   No current facility-administered medications for this visit.     Allergies  Allergen Reactions  . Floxacillin Base   . Statins     Makes him feel crazy.  . 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 The patient is clinically stable. We will see him in 2 years.    PVCs quiescent  I reviewed the physiology of orthostatic intolerance, the important contribution of heat and hydration

## 2015-09-27 NOTE — Patient Instructions (Signed)
Medication Instructions: - Your physician recommends that you continue on your current medications as directed. Please refer to the Current Medication list given to you today.  Labwork: - none ordered  Procedures/Testing: - none ordered  Follow-Up: - Your physician wants you to follow-up in: 2 years with Dr. Klein. You will receive a reminder letter in the mail two months in advance. If you don't receive a letter, please call our office to schedule the follow-up appointment.   Any Additional Special Instructions Will Be Listed Below (If Applicable).     If you need a refill on your cardiac medications before your next appointment, please call your pharmacy.   

## 2016-05-01 ENCOUNTER — Other Ambulatory Visit: Payer: Self-pay | Admitting: Family Medicine

## 2016-05-01 DIAGNOSIS — R1011 Right upper quadrant pain: Secondary | ICD-10-CM

## 2016-05-07 ENCOUNTER — Ambulatory Visit
Admission: RE | Admit: 2016-05-07 | Discharge: 2016-05-07 | Disposition: A | Discharge status: Home / Self Care | Payer: BLUE CROSS/BLUE SHIELD | Source: Ambulatory Visit | Attending: Family Medicine | Admitting: Family Medicine

## 2016-05-07 DIAGNOSIS — K769 Liver disease, unspecified: Secondary | ICD-10-CM | POA: Insufficient documentation

## 2016-05-07 DIAGNOSIS — K760 Fatty (change of) liver, not elsewhere classified: Secondary | ICD-10-CM | POA: Insufficient documentation

## 2016-05-07 DIAGNOSIS — R1011 Right upper quadrant pain: Secondary | ICD-10-CM | POA: Diagnosis not present

## 2018-04-14 ENCOUNTER — Encounter: Payer: Self-pay | Admitting: *Deleted

## 2018-04-14 ENCOUNTER — Observation Stay
Admission: EM | Admit: 2018-04-14 | Discharge: 2018-04-15 | Disposition: A | Discharge status: Home / Self Care | Payer: BLUE CROSS/BLUE SHIELD | Attending: Internal Medicine | Admitting: Internal Medicine

## 2018-04-14 ENCOUNTER — Emergency Department: Payer: BLUE CROSS/BLUE SHIELD

## 2018-04-14 ENCOUNTER — Other Ambulatory Visit: Payer: Self-pay

## 2018-04-14 DIAGNOSIS — I493 Ventricular premature depolarization: Secondary | ICD-10-CM | POA: Diagnosis not present

## 2018-04-14 DIAGNOSIS — Z888 Allergy status to other drugs, medicaments and biological substances status: Secondary | ICD-10-CM | POA: Insufficient documentation

## 2018-04-14 DIAGNOSIS — Z880 Allergy status to penicillin: Secondary | ICD-10-CM | POA: Insufficient documentation

## 2018-04-14 DIAGNOSIS — E785 Hyperlipidemia, unspecified: Secondary | ICD-10-CM | POA: Diagnosis not present

## 2018-04-14 DIAGNOSIS — N400 Enlarged prostate without lower urinary tract symptoms: Secondary | ICD-10-CM | POA: Insufficient documentation

## 2018-04-14 DIAGNOSIS — Z79899 Other long term (current) drug therapy: Secondary | ICD-10-CM | POA: Diagnosis not present

## 2018-04-14 DIAGNOSIS — Z8249 Family history of ischemic heart disease and other diseases of the circulatory system: Secondary | ICD-10-CM | POA: Insufficient documentation

## 2018-04-14 DIAGNOSIS — R7989 Other specified abnormal findings of blood chemistry: Secondary | ICD-10-CM | POA: Insufficient documentation

## 2018-04-14 DIAGNOSIS — I248 Other forms of acute ischemic heart disease: Secondary | ICD-10-CM | POA: Diagnosis not present

## 2018-04-14 DIAGNOSIS — Z87891 Personal history of nicotine dependence: Secondary | ICD-10-CM | POA: Insufficient documentation

## 2018-04-14 DIAGNOSIS — R072 Precordial pain: Secondary | ICD-10-CM | POA: Diagnosis not present

## 2018-04-14 DIAGNOSIS — J309 Allergic rhinitis, unspecified: Secondary | ICD-10-CM | POA: Diagnosis not present

## 2018-04-14 DIAGNOSIS — R079 Chest pain, unspecified: Secondary | ICD-10-CM | POA: Diagnosis present

## 2018-04-14 LAB — CBC
HCT: 41.3 % (ref 39.0–52.0)
Hemoglobin: 14.3 g/dL (ref 13.0–17.0)
MCH: 31.2 pg (ref 26.0–34.0)
MCHC: 34.6 g/dL (ref 30.0–36.0)
MCV: 90 fL (ref 80.0–100.0)
Platelets: 293 10*3/uL (ref 150–400)
RBC: 4.59 MIL/uL (ref 4.22–5.81)
RDW: 13 % (ref 11.5–15.5)
WBC: 12.2 10*3/uL — ABNORMAL HIGH (ref 4.0–10.5)
nRBC: 0 % (ref 0.0–0.2)

## 2018-04-14 LAB — BASIC METABOLIC PANEL
Anion gap: 12 (ref 5–15)
BUN: 14 mg/dL (ref 6–20)
CO2: 25 mmol/L (ref 22–32)
Calcium: 9.3 mg/dL (ref 8.9–10.3)
Chloride: 103 mmol/L (ref 98–111)
Creatinine, Ser: 1.03 mg/dL (ref 0.61–1.24)
GFR calc Af Amer: >60 mL/min (ref >60)
GFR calc non Af Amer: >60 mL/min (ref >60)
Glucose, Bld: 150 mg/dL — ABNORMAL HIGH (ref 70–99)
Potassium: 3.6 mmol/L (ref 3.5–5.1)
Sodium: 140 mmol/L (ref 135–145)

## 2018-04-14 LAB — TROPONIN I: Troponin I: <0.03 ng/mL (ref <0.03)

## 2018-04-14 MED ORDER — SODIUM CHLORIDE 0.9% FLUSH
3.0000 mL | Freq: Once | INTRAVENOUS | Status: AC
  Administered 2018-04-15: 3 mL via INTRAVENOUS

## 2018-04-14 NOTE — ED Provider Notes (Signed)
[facility] Emergency Department Provider Note   ____________________________________________   First MD Initiated Contact with Patient 04/14/18 2317     (approximate)  I have reviewed the triage vital signs and the nursing notes.   HISTORY  Chief Complaint Chest Pain    HPI Michael Fletcher is a 58 y.o. male brought to the ED from home via EMS with a chief complaint of chest pain.  Patient was at rest when he had 3 episodes of waxing/waning central chest tightness radiating to his left arm.  Denies associated diaphoresis, shortness of breath, nausea/vomiting, palpitations or dizziness.  No similar symptoms previously.  Denies recent fever, cough, congestion, abdominal pain, diarrhea.  Denies recent travel or trauma.  Denies exposure to persons diagnosed with coronavirus.       Past Medical History:  Diagnosis Date  . Atypical chest pain    a. 07/2015 - ED visit.  . Chronic sinusitis   . Hx: meningitis   . Hyperlipidemia   . 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 10/2005 cMRI: EF 60%, mild basal and mid RV wall HK - possibly 2/2 tethering from prominent moderator band, no fatty infiltration making RV dysplasia less likely, nl atrial sizes;  c. 10/2010 Echo: EF 55-60%, no rwma, midlly dil Ao root.    Patient Active Problem List   Diagnosis Date Noted  . Hyperlipidemia   . Atypical chest pain   . PVC-RVOT morphology 10/07/2010    Past Surgical History:  Procedure Laterality Date  . NASAL SINUS SURGERY  01/2010    Prior to Admission medications   Medication Sig Start Date End Date Taking? Authorizing Provider  fluticasone (FLONASE) 50 MCG/ACT nasal spray 2 sprays in each nostril twice a day 11/15/09   [provider]  propranolol (INDERAL) 10 MG tablet Take 1 tablet (10 mg total) by mouth as needed. 04/16/15   Duke SalviaKlein, Steven C, MD  tamsulosin (FLOMAX) 0.4 MG CAPS capsule TK ONE C PO QD 1/2 HOUR AFTER A MEAL  08/21/15   [provider]    Allergies Floxacillin base; Statins; and Sulfur  Family History  Problem Relation Age of Onset  . Heart attack Father     Social History Social History   Tobacco Use  . Smoking status: Former Smoker    Packs/day: 0.25    Years: 13.00    Pack years: 3.25    Types: Cigarettes  . Smokeless tobacco: Never Used  Substance Use Topics  . Alcohol use: Not Currently    Comment: occassional  . Drug use: No    Review of Systems  Constitutional: No fever/chills Eyes: No visual changes. ENT: No sore throat. Cardiovascular: Positive for chest pain. Respiratory: Denies shortness of breath. Gastrointestinal: No abdominal pain.  No nausea, no vomiting.  No diarrhea.  No constipation. Genitourinary: Negative for dysuria. Musculoskeletal: Negative for back pain. Skin: Negative for rash. Neurological: Negative for headaches, focal weakness or numbness.   ____________________________________________   PHYSICAL EXAM:  VITAL SIGNS: ED Triage Vitals  Enc Vitals Group     BP 04/14/18 2116 (!) 128/91     Pulse Rate 04/14/18 2116 82     Resp 04/14/18 2116 20     Temp 04/14/18 2116 98.2 F (36.8 C)     Temp Source 04/14/18 2116 Oral     SpO2 04/14/18 2116 99 %     Weight 04/14/18 2115 270 lb (122.5 kg)     Height 04/14/18 2115  6\' 4"  (1.93 m)     Head Circumference --      Peak Flow --      Pain Score 04/14/18 2114 1     Pain Loc --      Pain Edu? --      Excl. in GC? --     Constitutional: Alert and oriented. Well appearing and in no acute distress. Eyes: Conjunctivae are normal. PERRL. EOMI. Head: Atraumatic. Nose: No congestion/rhinnorhea. Mouth/Throat: Mucous membranes are moist.  Oropharynx non-erythematous. Neck: No stridor.   Cardiovascular: Normal rate, regular rhythm. Grossly normal heart sounds.  Good peripheral circulation. Respiratory: Normal respiratory effort.  No retractions. Lungs CTAB. Gastrointestinal: Soft and  nontender. No distention. No abdominal bruits. No CVA tenderness. Musculoskeletal: No lower extremity tenderness nor edema.  No joint effusions. Neurologic:  Normal speech and language. No gross focal neurologic deficits are appreciated. No gait instability. Skin:  Skin is warm, dry and intact. No rash noted. Psychiatric: Mood and affect are normal. Speech and behavior are normal.  ____________________________________________   LABS (all labs ordered are listed, but only abnormal results are displayed)  Labs Reviewed  BASIC METABOLIC PANEL - Abnormal; Notable for the following components:      Result Value   Glucose, Bld 150 (*)    All other components within normal limits  CBC - Abnormal; Notable for the following components:   WBC 12.2 (*)    All other components within normal limits  TROPONIN I   ____________________________________________  EKG  ED ECG REPORT I, Shanica Castellanos J, the attending physician, personally viewed and interpreted this ECG.   Date: 04/14/2018  EKG Time: 2117  Rate: 81  Rhythm: normal EKG, normal sinus rhythm  Axis: Normal  Intervals:none  ST&T Change: Nonspecific  ____________________________________________  RADIOLOGY  ED MD interpretation: No acute cardiopulmonary process  Official radiology report(s): Dg Chest 2 View  Result Date: 04/14/2018 CLINICAL DATA:  Chest pain. EXAM: CHEST - 2 VIEW COMPARISON:  07/11/2015 FINDINGS: The cardiomediastinal contours are normal, heart size upper normal. The lungs are clear. Pulmonary vasculature is normal. No consolidation, pleural effusion, or pneumothorax. No acute osseous abnormalities are seen. IMPRESSION: No acute findings. Electronically Signed   By: Narda Rutherford M.D.   On: 04/14/2018 22:25    ____________________________________________   PROCEDURES  Procedure(s) performed (including Critical Care):  Procedures  CRITICAL CARE Performed by: Irean Hong   Total critical care time: 30  minutes  Critical care time was exclusive of separately billable procedures and treating other patients.  Critical care was necessary to treat or prevent imminent or life-threatening deterioration.  Critical care was time spent personally by me on the following activities: development of treatment plan with patient and/or surrogate as well as nursing, discussions with consultants, evaluation of patient's response to treatment, examination of patient, obtaining history from patient or surrogate, ordering and performing treatments and interventions, ordering and review of laboratory studies, ordering and review of radiographic studies, pulse oximetry and re-evaluation of patient's condition. ____________________________________________   INITIAL IMPRESSION / ASSESSMENT AND PLAN / ED COURSE  As part of my medical decision making, I reviewed the following data within the electronic MEDICAL RECORD NUMBER Nursing notes reviewed and incorporated, Labs reviewed, EKG interpreted, Old chart reviewed, Radiograph reviewed, Discussed with admitting physician  Dr. Nancy Marus and Notes from prior ED visits        58 year old male with hypertension, hyperlipidemia, PVC with RVOT morphology presenting with waxing/waning chest tightness. Differential diagnosis includes, but is not limited to,  ACS, aortic dissection, pulmonary embolism, cardiac tamponade, pneumothorax, pneumonia, pericarditis, myocarditis, GI-related causes including esophagitis/gastritis, and musculoskeletal chest wall pain.    Initial troponin and EKG unremarkable.  Chest pain currently 0/10 after aspirin administered by EMS.  Discussed case with hospitalist Dr. Nancy Marus who will evaluate patient in the emergency department for admission.      ____________________________________________   FINAL CLINICAL IMPRESSION(S) / ED DIAGNOSES  Final diagnoses:  Chest pain, unspecified type     ED Discharge Orders    None       Note:  This document was  prepared using Dragon voice recognition software and may include unintentional dictation errors.   Irean Hong, MD 04/15/18 [phone]

## 2018-04-14 NOTE — ED Notes (Signed)
Pt went to X-Ray.  

## 2018-04-14 NOTE — ED Triage Notes (Signed)
Pt brought in via ems in a wheelchair to triage.  Pt has chest pain.  No sob.  No n/v.  Pt reports pain in left arm.  Pt alert  Speech clear.  Iv in place.  Ems gave 324 asa.

## 2018-04-15 ENCOUNTER — Other Ambulatory Visit: Payer: Self-pay

## 2018-04-15 DIAGNOSIS — Z87891 Personal history of nicotine dependence: Secondary | ICD-10-CM

## 2018-04-15 DIAGNOSIS — R079 Chest pain, unspecified: Secondary | ICD-10-CM | POA: Diagnosis not present

## 2018-04-15 DIAGNOSIS — I248 Other forms of acute ischemic heart disease: Secondary | ICD-10-CM | POA: Diagnosis not present

## 2018-04-15 DIAGNOSIS — I493 Ventricular premature depolarization: Secondary | ICD-10-CM

## 2018-04-15 LAB — COMPREHENSIVE METABOLIC PANEL
ALT: 41 U/L (ref 0–44)
AST: 28 U/L (ref 15–41)
Albumin: 3.7 g/dL (ref 3.5–5.0)
Alkaline Phosphatase: 34 U/L — ABNORMAL LOW (ref 38–126)
Anion gap: 12 (ref 5–15)
BUN: 12 mg/dL (ref 6–20)
CO2: 25 mmol/L (ref 22–32)
Calcium: 9 mg/dL (ref 8.9–10.3)
Chloride: 105 mmol/L (ref 98–111)
Creatinine, Ser: 0.89 mg/dL (ref 0.61–1.24)
GFR calc Af Amer: >60 mL/min (ref >60)
GFR calc non Af Amer: >60 mL/min (ref >60)
Glucose, Bld: 102 mg/dL — ABNORMAL HIGH (ref 70–99)
Potassium: 3.9 mmol/L (ref 3.5–5.1)
Sodium: 142 mmol/L (ref 135–145)
Total Bilirubin: 0.6 mg/dL (ref 0.3–1.2)
Total Protein: 6.6 g/dL (ref 6.5–8.1)

## 2018-04-15 LAB — CBC WITH DIFFERENTIAL/PLATELET
Abs Immature Granulocytes: 0.03 10*3/uL (ref 0.00–0.07)
Basophils Absolute: 0 10*3/uL (ref 0.0–0.1)
Basophils Relative: 1 %
Eosinophils Absolute: 0.3 10*3/uL (ref 0.0–0.5)
Eosinophils Relative: 4 %
HCT: 40.6 % (ref 39.0–52.0)
Hemoglobin: 14.2 g/dL (ref 13.0–17.0)
Immature Granulocytes: 0 %
Lymphocytes Relative: 39 %
Lymphs Abs: 3 10*3/uL (ref 0.7–4.0)
MCH: 31.4 pg (ref 26.0–34.0)
MCHC: 35 g/dL (ref 30.0–36.0)
MCV: 89.8 fL (ref 80.0–100.0)
Monocytes Absolute: 0.8 10*3/uL (ref 0.1–1.0)
Monocytes Relative: 10 %
Neutro Abs: 3.6 10*3/uL (ref 1.7–7.7)
Neutrophils Relative %: 46 %
Platelets: 258 10*3/uL (ref 150–400)
RBC: 4.52 MIL/uL (ref 4.22–5.81)
RDW: 13 % (ref 11.5–15.5)
WBC: 7.7 10*3/uL (ref 4.0–10.5)
nRBC: 0 % (ref 0.0–0.2)

## 2018-04-15 LAB — TROPONIN I
Troponin I: 0.08 ng/mL (ref <0.03)
Troponin I: 0.08 ng/mL (ref <0.03)

## 2018-04-15 LAB — CREATININE, SERUM
Creatinine, Ser: 1.02 mg/dL (ref 0.61–1.24)
GFR calc Af Amer: >60 mL/min (ref >60)
GFR calc non Af Amer: >60 mL/min (ref >60)

## 2018-04-15 LAB — CKMB (ARMC ONLY)
CK, MB: 3.7 ng/mL (ref 0.5–5.0)
CK, MB: 3.8 ng/mL (ref 0.5–5.0)
CK, MB: 3.1 ng/mL (ref 0.5–5.0)

## 2018-04-15 MED ORDER — NITROGLYCERIN 0.4 MG SL SUBL: 0.4000 mg | SUBLINGUAL_TABLET | SUBLINGUAL | Status: DC | PRN

## 2018-04-15 MED ORDER — ENOXAPARIN SODIUM 40 MG/0.4ML ~~LOC~~ SOLN
40.0000 mg | SUBCUTANEOUS | Status: DC
  Administered 2018-04-15: 02:00:00 40 mg via SUBCUTANEOUS
  Filled 2018-04-15: qty 0.4

## 2018-04-15 MED ORDER — ZOLPIDEM TARTRATE 5 MG PO TABS
5.0000 mg | ORAL_TABLET | Freq: Every evening | ORAL | Status: DC | PRN
  Filled 2018-04-15: qty 1

## 2018-04-15 MED ORDER — LIDOCAINE VISCOUS HCL 2 % MT SOLN
15.0000 mL | Freq: Once | OROMUCOSAL | Status: AC
  Administered 2018-04-15: 15 mL via ORAL
  Filled 2018-04-15: qty 15

## 2018-04-15 MED ORDER — ACETAMINOPHEN 325 MG PO TABS
650.0000 mg | ORAL_TABLET | ORAL | Status: DC | PRN
  Administered 2018-04-15 (×2): 650 mg via ORAL
  Filled 2018-04-15 (×2): qty 2

## 2018-04-15 MED ORDER — TAMSULOSIN HCL 0.4 MG PO CAPS
0.4000 mg | ORAL_CAPSULE | Freq: Every day | ORAL | Status: DC
  Administered 2018-04-15: 0.4 mg via ORAL
  Filled 2018-04-15: qty 1

## 2018-04-15 MED ORDER — NITROGLYCERIN 0.4 MG SL SUBL: 0.4000 mg | SUBLINGUAL_TABLET | SUBLINGUAL | 0 refills | Status: AC | PRN

## 2018-04-15 MED ORDER — ONDANSETRON HCL 4 MG/2ML IJ SOLN: 4.0000 mg | Freq: Four times a day (QID) | INTRAMUSCULAR | Status: DC | PRN

## 2018-04-15 MED ORDER — SODIUM CHLORIDE 0.9 % IV SOLN
INTRAVENOUS | Status: DC
  Administered 2018-04-15: 02:00:00 via INTRAVENOUS

## 2018-04-15 MED ORDER — FLUTICASONE PROPIONATE 50 MCG/ACT NA SUSP
2.0000 | Freq: Two times a day (BID) | NASAL | Status: DC | PRN
  Filled 2018-04-15: qty 16

## 2018-04-15 MED ORDER — ASPIRIN EC 325 MG PO TBEC
325.0000 mg | DELAYED_RELEASE_TABLET | Freq: Every day | ORAL | Status: DC
  Administered 2018-04-15: 325 mg via ORAL
  Filled 2018-04-15: qty 1

## 2018-04-15 MED ORDER — MORPHINE SULFATE (PF) 2 MG/ML IV SOLN: 2.0000 mg | INTRAVENOUS | Status: DC | PRN

## 2018-04-15 MED ORDER — ALUM & MAG HYDROXIDE-SIMETH 200-200-20 MG/5ML PO SUSP
30.0000 mL | Freq: Once | ORAL | Status: AC
  Administered 2018-04-15: 02:00:00 30 mL via ORAL
  Filled 2018-04-15: qty 30

## 2018-04-15 NOTE — ED Notes (Signed)
ED TO INPATIENT HANDOFF REPORT  ED Nurse Name and Phone #: Roberts Gaudy [phone]  S Name/Age/Gender Michael Fletcher 58 y.o. male Room/Bed: ED18A/ED18A  Code Status   Code Status: Full Code  Home/SNF/Other Home Patient oriented to: self, place, time and situation Is this baseline? Yes   Triage Complete: Triage complete  Chief Complaint chest pain ems  Triage Note Pt brought in via ems in a wheelchair to triage.  Pt has chest pain.  No sob.  No n/v.  Pt reports pain in left arm.  Pt alert  Speech clear.  Iv in place.  Ems gave 324 asa.     Allergies Allergies  Allergen Reactions  . Floxacillin Base Swelling    Swelling to hands  . Sulfur Swelling    Swelling to hands  . Statins     Makes him feel crazy.    Level of Care/Admitting Diagnosis ED Disposition    ED Disposition Condition Comment   Admit  Hospital Area: [facility] [[id number]]  Level of Care: Telemetry [5]  Diagnosis: Chest pain [841324]  Admitting Physician: Hannah Beat [[id number]]  Attending Physician: Hannah Beat [[id number]]  PT Class (Do Not Modify): Observation [104]  PT Acc Code (Do Not Modify): Observation [10022]       B Medical/Surgery History Past Medical History:  Diagnosis Date  . Atypical chest pain    a. 07/2015 - ED visit.  . Chronic sinusitis   . Hx: meningitis   . Hyperlipidemia   . 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 10/2005 cMRI: EF 60%, mild basal and mid RV wall HK - possibly 2/2 tethering from prominent moderator band, no fatty infiltration making RV dysplasia less likely, nl atrial sizes;  c. 10/2010 Echo: EF 55-60%, no rwma, midlly dil Ao root.   Past Surgical History:  Procedure Laterality Date  . NASAL SINUS SURGERY  01/2010     A IV Location/Drains/Wounds Patient Lines/Drains/Airways Status   Active Line/Drains/Airways    None          Intake/Output Last 24 hours No intake or output data in the 24 hours ending  04/15/18 0048  Labs/Imaging Results for orders placed or performed during the hospital encounter of 04/14/18 (from the past 48 hour(s))  Basic metabolic panel     Status: Abnormal   Collection Time: 04/14/18  9:19 PM  Result Value Ref Range   Sodium 140 135 - 145 mmol/L   Potassium 3.6 3.5 - 5.1 mmol/L   Chloride 103 98 - 111 mmol/L   CO2 25 22 - 32 mmol/L   Glucose, Bld 150 (H) 70 - 99 mg/dL   BUN 14 6 - 20 mg/dL   Creatinine, Ser 3.47 0.61 - 1.24 mg/dL   Calcium 9.3 8.9 - 42.5 mg/dL   GFR calc non Af Amer >60 >60 mL/min   GFR calc Af Amer >60 >60 mL/min   Anion gap 12 5 - 15    Comment: Performed at [facility], [address] [place], [place] [address]  CBC     Status: Abnormal   Collection Time: 04/14/18  9:19 PM  Result Value Ref Range   WBC 12.2 (H) 4.0 - 10.5 K/uL   RBC 4.59 4.22 - 5.81 MIL/uL   Hemoglobin 14.3 13.0 - 17.0 g/dL   HCT 75.6 43.3 - 29.5 %   MCV 90.0 80.0 - 100.0 fL   MCH 31.2 26.0 - 34.0 pg  MCHC 34.6 30.0 - 36.0 g/dL   RDW 29.513.0 62.111.5 - 30.815.5 %   Platelets 293 150 - 400 K/uL   nRBC 0.0 0.0 - 0.2 %    Comment: Performed at [facility], [address] [place], [place] [address]  Troponin I - ONCE - STAT     Status: None   Collection Time: 04/14/18  9:19 PM  Result Value Ref Range   Troponin I <0.03 <0.03 ng/mL    Comment: Performed at [facility], [address] [place], [place] [address]   Dg Chest 2 View  Result Date: 04/14/2018 CLINICAL DATA:  Chest pain. EXAM: CHEST - 2 VIEW COMPARISON:  07/11/2015 FINDINGS: The cardiomediastinal contours are normal, heart size upper normal. The lungs are clear. Pulmonary vasculature is normal. No consolidation, pleural effusion, or pneumothorax. No acute osseous abnormalities are seen. IMPRESSION: No acute findings. Electronically Signed   By: Narda RutherfordMelanie  Sanford M.D.   On: 04/14/2018 22:25    Pending Labs Unresulted Labs (From admission, onward)    Start     Ordered   04/22/18 0500   Creatinine, serum  (enoxaparin (LOVENOX)    CrCl >/= 30 ml/min)  Weekly,   STAT    Comments:  while on enoxaparin therapy    04/15/18 0008   04/15/18 0500  Comprehensive metabolic panel  Tomorrow morning,   STAT     04/15/18 0011   04/15/18 0500  CBC with Differential/Platelet  Tomorrow morning,   STAT     04/15/18 0011   04/15/18 0011  Troponin I - Now Then Q6H  Now then every 6 hours,   STAT     04/15/18 0011   04/15/18 0011  CK[facility] only)  Now then every 6 hours,   STAT     04/15/18 0011   04/15/18 0008  Creatinine, serum  (enoxaparin (LOVENOX)    CrCl >/= 30 ml/min)  Once,   STAT    Comments:  Baseline for enoxaparin therapy IF NOT ALREADY DRAWN.    04/15/18 0008          Vitals/Pain Today's Vitals   04/14/18 2345 04/15/18 0000 04/15/18 0020 04/15/18 0031  BP:  125/75  122/78  Pulse: 66 65  (!) 59  Resp: 16 12  12   Temp:      TempSrc:      SpO2: 96% 92%  96%  Weight:      Height:      PainSc:   0-No pain 0-No pain    Isolation Precautions No active isolations  Medications Medications  sodium chloride flush (NS) 0.9 % injection 3 mL (has no administration in time range)  tamsulosin (FLOMAX) capsule 0.4 mg (has no administration in time range)  fluticasone (FLONASE) 50 MCG/ACT nasal spray 2 spray (has no administration in time range)  acetaminophen (TYLENOL) tablet 650 mg (has no administration in time range)  ondansetron (ZOFRAN) injection 4 mg (has no administration in time range)  0.9 %  sodium chloride infusion (has no administration in time range)  enoxaparin (LOVENOX) injection 40 mg (has no administration in time range)  morphine 2 MG/ML injection 2 mg (has no administration in time range)  alum & mag hydroxide-simeth (MAALOX/MYLANTA) 200-200-20 MG/5ML suspension 30 mL (has no administration in time range)    And  lidocaine (XYLOCAINE) 2 % viscous mouth solution 15 mL (has no administration in time range)  aspirin EC tablet 325 mg (has no administration  in time range)  zolpidem (AMBIEN) tablet 5  mg (has no administration in time range)  nitroGLYCERIN (NITROSTAT) SL tablet 0.4 mg (has no administration in time range)    Mobility walks Low fall risk   Focused Assessments Cardiac Assessment Handoff:    Lab Results  Component Value Date   TROPONINI <0.03 04/14/2018   No results found for: DDIMER Does the Patient currently have chest pain? No     R Recommendations: See Admitting Provider Note  Report given to:   Additional Notes:

## 2018-04-15 NOTE — Discharge Summary (Signed)
[facility] [place] [facility]   PATIENT NAME: Michael Fletcher    MR#:  [id number]  DATE OF BIRTH:  June 30, 1960  DATE OF ADMISSION:  04/14/2018 ADMITTING PHYSICIAN: Hannah Beat, MD  DATE OF DISCHARGE: 04/15/2018  PRIMARY CARE PHYSICIAN: Titus Mould, NP   ADMISSION DIAGNOSIS:  Chest pain, unspecified type [R07.9]  DISCHARGE DIAGNOSIS:  Atypical chest pain Hyperlipidemia Benign prostate hypertrophy  SECONDARY DIAGNOSIS:   Past Medical History:  Diagnosis Date  . Atypical chest pain    a. 07/2015 - ED visit.  . Chronic sinusitis   . Hx: meningitis   . Hyperlipidemia   . 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 10/2005 cMRI: EF 60%, mild basal and mid RV wall HK - possibly 2/2 tethering from prominent moderator band, no fatty infiltration making RV dysplasia less likely, nl atrial sizes;  c. 10/2010 Echo: EF 55-60%, no rwma, midlly dil Ao root.     ADMITTING HISTORY Michael Fletcher  is a 58 y.o. male with a known history of dyslipidemia, BPH and allergic rhinitis who presented to the emergency room with acute onset of midsternal chest pain felt as pressure and graded initially 3-4/10 in severity and increasing to 9-01/08/08 in severity with radiation to his left arm without nausea vomiting or diaphoresis or palpitations.  He denies any dyspnea, orthopnea or paroxysmal nocturnal dyspnea.  No leg pain or edema or recent travels or surgeries.  He denied any cough or wheezing or shortness of breath.  No fever or chills.  No recent sick exposures or travel out of the state or out of the country.Upon presentation to the emergency room, vital signs were within normal except for blood pressure 128/91.  Labs reveal potassium of 3.6 and his troponin I was less than 0.03.  CBC showed mild leukocytosis of 12.2.  EKG showed normal sinus rhythm with rate of 81 with slightly poor R wave progression.  PA and lateral chest ray showed no acute  cardiopulmonary disease.  The patient's most recent 2D echo was not July 2017 and it revealed an EF of 60 to 65% with normal wall motion.  He had a normal myocardial perfusion study the same year.  A cardiac MRI that year revealed quantitative EF of 60% but showed right ventricular mild hypokinesis in the mid RV wall, possibly secondary to teetering from a prominent moderator band.  It was of normal size.The patient was given aspirin by EMS and he was pain-free during his ER stay in my interview.  He will be admitted to an observation telemetry bed for further evaluation and monitoring.  HOSPITAL COURSE:  Patient was admitted to telemetry.  Serial troponins were done.  Troponins were borderline.  Cardiology evaluated the patient and recommended outpatient stress test.  Chest pain completely resolved.  His last echocardiogram and cardiac MRI were reviewed.  As per cardiology recommendations patient will be discharged home with outpatient stress test.  CONSULTS OBTAINED:  Treatment Team:  Antonieta Iba, MD  DRUG ALLERGIES:   Allergies  Allergen Reactions  . Floxacillin Base Swelling    Swelling to hands  . Sulfur Swelling    Swelling to hands  . Statins     Makes him feel crazy.    DISCHARGE MEDICATIONS:   Allergies as of 04/15/2018      Reactions   Floxacillin Base Swelling   Swelling to hands   Sulfur Swelling   Swelling to hands  Statins    Makes him feel crazy.      Medication List    TAKE these medications   fluticasone 50 MCG/ACT nasal spray Commonly known as:  FLONASE 2 sprays in each nostril twice a day   multivitamin tablet Take 1 tablet by mouth daily.   nitroGLYCERIN 0.4 MG SL tablet Commonly known as:  NITROSTAT Place 1 tablet (0.4 mg total) under the tongue every 5 (five) minutes as needed for chest pain.   propranolol 10 MG tablet Commonly known as:  INDERAL Take 1 tablet (10 mg total) by mouth as needed.   tamsulosin 0.4 MG Caps capsule Commonly  known as:  FLOMAX TK ONE C PO QD 1/2 HOUR AFTER A MEAL       Today  Patient seen today No complaints of any chest pain No shortness of breath Tolerating diet okay Hemodynamically stable VITAL SIGNS:  Blood pressure 129/75, pulse 64, temperature 97.9 F (36.6 C), temperature source Oral, resp. rate 18, height 6\' 4"  (1.93 m), weight 122.5 kg, SpO2 97 %.  I/O:    Intake/Output Summary (Last 24 hours) at 04/15/2018 1328 Last data filed at 04/15/2018 0151 Gross per 24 hour  Intake 210 ml  Output 340 ml  Net -130 ml    PHYSICAL EXAMINATION:  Physical Exam  GENERAL:  58 y.o.-year-old patient lying in the bed with no acute distress.  LUNGS: Normal breath sounds bilaterally, no wheezing, rales,rhonchi or crepitation. No use of accessory muscles of respiration.  CARDIOVASCULAR: S1, S2 normal. No murmurs, rubs, or gallops.  ABDOMEN: Soft, non-tender, non-distended. Bowel sounds present. No organomegaly or mass.  NEUROLOGIC: Moves all 4 extremities. PSYCHIATRIC: The patient is alert and oriented x 3.  SKIN: No obvious rash, lesion, or ulcer.   DATA REVIEW:   CBC Recent Labs  Lab 04/15/18 0558  WBC 7.7  HGB 14.2  HCT 40.6  PLT 258    Chemistries  Recent Labs  Lab 04/15/18 0558  NA 142  K 3.9  CL 105  CO2 25  GLUCOSE 102*  BUN 12  CREATININE 0.89  CALCIUM 9.0  AST 28  ALT 41  ALKPHOS 34*  BILITOT 0.6    Cardiac Enzymes Recent Labs  Lab 04/15/18 0558  TROPONINI 0.08*    Microbiology Results  No results found for this or any previous visit.  RADIOLOGY:  Dg Chest 2 View  Result Date: 04/14/2018 CLINICAL DATA:  Chest pain. EXAM: CHEST - 2 VIEW COMPARISON:  07/11/2015 FINDINGS: The cardiomediastinal contours are normal, heart size upper normal. The lungs are clear. Pulmonary vasculature is normal. No consolidation, pleural effusion, or pneumothorax. No acute osseous abnormalities are seen. IMPRESSION: No acute findings. Electronically Signed   By: Narda RutherfordMelanie   Sanford M.D.   On: 04/14/2018 22:25    Follow up with PCP in 1 week.  Management plans discussed with the patient, family and they are in agreement.  CODE STATUS: Full code    Code Status Orders  (From admission, onward)         Start     Ordered   04/15/18 0006  Full code  Continuous     04/15/18 0008        Code Status History    This patient has a current code status but no historical code status.    Advance Directive Documentation     Most Recent Value  Type of Advance Directive  Living will  Pre-existing out of facility DNR order (yellow form or pink MOST  form)  -  "MOST" Form in Place?  -      TOTAL TIME TAKING CARE OF THIS PATIENT ON DAY OF DISCHARGE: more than 35 minutes.   Ihor Austin M.D on 04/15/2018 at 1:28 PM  Between 7am to 6pm - Pager - [phone]  After 6pm go to [email or web address] - password [email or web address]  [facility] [place]  Office  [phone]  CC: Primary care physician; White, Arlyss Repress, NP  Note: This dictation was prepared with Dragon dictation along with smaller phrase technology. Any transcriptional errors that result from this process are unintentional.

## 2018-04-15 NOTE — Progress Notes (Signed)
Patient is alert and oriented and able to verbalize needs. Walked around nurses station on room air without desaturations. VSS. PIVs removed. Printed AVS given to patient in discharge packet. All instructions and follow up care gone over with patient at this time. No concerns voiced. All belongings packed.  Suzan Slick, RN

## 2018-04-15 NOTE — Consult Note (Signed)
Cardiology Consultation:   Patient ID: Michael Fletcher MRN: [id number]; DOB: 03-29-1960  Admit date: 04/14/2018 Date of Consult: 04/15/2018  Primary Care Provider: Titus Mould, NP Primary Cardiologist:Dr. Graciela Husbands Primary Electrophysiologist:  Dr. Graciela Husbands   Patient Profile:   Michael Fletcher is a 58 y.o. male with a hx of RVOT PVCs, history of atypical chest pain, hyperlipidemia, and remote history of tobacco abuse who is being seen today for the evaluation of chest pain and elevated troponin at the request of Dr. Arville Care.  History of Present Illness:   Mr. Michael Fletcher is a 58 year old male with PMH as above and seen today for chest pain with minimally elevated troponin. In 2007, he had a cardiac MRI, which was normal.  He underwent echocardiogram and stress test, performed 07/2015 and after presenting to the ED with complaint of atypical chest pain.  EF normal with mild aortic dilation.  Stress test ruled a low risk study.    Patient stated that he was in his usual state of health leading up to this admission with no recent episodes of chest pain with exertion or at rest.  He stated that he monitors his heart rate with his watch and exercises regularly without chest pain.  When he exercises, he does not feel short of breath and make sure that his heart rate stays within target range.  He reported he is not particularly bothered by his PVCs, as long as he avoids soda, especially [facility].  He reportedly has started drinking slightly more caffeine daily and in the setting of staying home more often due to social distancing with current pandemic.  On 04/14/2018, he presented to the [facility] ED with complaint of chest pain/pressure.  He reported that earlier in the day he had mowed his lawn.  He then ate supper and was sitting in a chair when the chest pain started at around 8 PM that evening.  He described the chest pain as nonpleuritic, left-sided chest pressure that radiated down his left arm.  He  stated that he had 2 episodes of this chest pressure.  With his second episode of chest pain, he decided to present to the emergency room.  He did not take anything for his chest pain/pressure at home.  His chest pressure lasted until he reportedly received ASA 324mg  in the ED, after which time it completely resolved without further epsiodes. He denied any associated symptoms with this chest pain including diaphoresis, shortness of breath, nausea/vomiting, racing heart rate, palpitations, or symptoms of presyncope.  In the ED, vitals were significant for BP 128/91.  EKG was normal sinus rhythm, heart rate 81 bpm.  Chest x-ray without acute findings.  Labs showed leukocytosis with WBC 12.2, hypokalemia with potassium 3.6, creatinine elevated from baseline at 1.03, and initial troponin negative <0.03   0.08 x2 with CK MB 3.1.  Past Medical History:  Diagnosis Date  . Atypical chest pain    a. 07/2015 - ED visit.  . Chronic sinusitis   . Hx: meningitis   . Hyperlipidemia   . 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 10/2005 cMRI: EF 60%, mild basal and mid RV wall HK - possibly 2/2 tethering from prominent moderator band, no fatty infiltration making RV dysplasia less likely, nl atrial sizes;  c. 10/2010 Echo: EF 55-60%, no rwma, midlly dil Ao root.    Past Surgical History:  Procedure Laterality Date  . NASAL SINUS SURGERY  01/2010  Home Medications:  Prior to Admission medications   Medication Sig Start Date End Date Taking? Authorizing Provider  Multiple Vitamin (MULTIVITAMIN) tablet Take 1 tablet by mouth daily.   Yes [provider]  propranolol (INDERAL) 10 MG tablet Take 1 tablet (10 mg total) by mouth as needed. 04/16/15  Yes Duke Salvia, MD  tamsulosin (FLOMAX) 0.4 MG CAPS capsule TK ONE C PO QD 1/2 HOUR AFTER A MEAL 08/21/15  Yes [provider]  fluticasone (FLONASE) 50 MCG/ACT nasal spray 2 sprays in each nostril twice a day 11/15/09    [provider]  nitroGLYCERIN (NITROSTAT) 0.4 MG SL tablet Place 1 tablet (0.4 mg total) under the tongue every 5 (five) minutes as needed for chest pain. 04/15/18   Ihor Austin, MD    Inpatient Medications: Scheduled Meds: . aspirin EC  325 mg Oral Daily  . enoxaparin (LOVENOX) injection  40 mg Subcutaneous Q24H  . tamsulosin  0.4 mg Oral QPC breakfast   Continuous Infusions: . sodium chloride 75 mL/hr at 04/15/18 0151   PRN Meds: acetaminophen, fluticasone, morphine injection, nitroGLYCERIN, ondansetron (ZOFRAN) IV, zolpidem  Allergies:    Allergies  Allergen Reactions  . Floxacillin Base Swelling    Swelling to hands  . Sulfur Swelling    Swelling to hands  . Statins     Makes him feel crazy.    Social History:   Social History   Socioeconomic History  . Marital status: Married    Spouse name: Not on file  . Number of children: Not on file  . Years of education: Not on file  . Highest education level: Not on file  Occupational History  . Not on file  Social Needs  . Financial resource strain: Not on file  . Food insecurity:    Worry: Not on file    Inability: Not on file  . Transportation needs:    Medical: Not on file    Non-medical: Not on file  Tobacco Use  . Smoking status: Former Smoker    Packs/day: 0.25    Years: 13.00    Pack years: 3.25    Types: Cigarettes  . Smokeless tobacco: Never Used  Substance and Sexual Activity  . Alcohol use: Not Currently    Comment: occassional  . Drug use: No  . Sexual activity: Not on file  Lifestyle  . Physical activity:    Days per week: Not on file    Minutes per session: Not on file  . Stress: Not on file  Relationships  . Social connections:    Talks on phone: Not on file    Gets together: Not on file    Attends religious service: Not on file    Active member of club or organization: Not on file    Attends meetings of clubs or organizations: Not on file    Relationship status: Not on file   . Intimate partner violence:    Fear of current or ex partner: Not on file    Emotionally abused: Not on file    Physically abused: Not on file    Forced sexual activity: Not on file  Other Topics Concern  . Not on file  Social History Narrative  . Not on file    Family History:    Family History  Problem Relation Age of Onset  . Heart attack Father      ROS:  Please see the history of present illness.  Review of Systems  Constitutional: Negative  for chills, diaphoresis, fever, malaise/fatigue and weight loss.  HENT: Negative for congestion, hearing loss and tinnitus.   Respiratory: Negative for cough, hemoptysis, shortness of breath and wheezing.   Cardiovascular: Positive for chest pain. Negative for leg swelling.       No current chest pain. Resolved with ASA in ED  Gastrointestinal: Negative for constipation, diarrhea, melena, nausea and vomiting.       Reported history of digestion issues  Genitourinary: Negative for dysuria and hematuria.  Musculoskeletal: Negative for falls.  Neurological: Negative for weakness.  All other systems reviewed and are negative.   All other ROS reviewed and negative.     Physical Exam/Data:   Vitals:   04/15/18 0000 04/15/18 0031 04/15/18 0454 04/15/18 0803  BP: 125/75 122/78 118/78 129/75  Pulse: 65 (!) 59 (!) 58 64  Resp: 12 12 18 18   Temp:   97.6 F (36.4 C) 97.9 F (36.6 C)  TempSrc:   Oral Oral  SpO2: 92% 96% 96% 97%  Weight:      Height:        Intake/Output Summary (Last 24 hours) at 04/15/2018 1141 Last data filed at 04/15/2018 0151 Gross per 24 hour  Intake 210 ml  Output 340 ml  Net -130 ml   Filed Weights   04/14/18 2115  Weight: 122.5 kg   Body mass index is 32.87 kg/m.  General:  Well nourished, well developed, in no acute distress HEENT: normal Lymph: no adenopathy Neck: no JVD Endocrine:  No thryomegaly Vascular: No carotid bruits; FA pulses 2+ bilaterally without bruits  Cardiac:  normal S1, S2; RRR;  no murmur Lungs:  clear to auscultation bilaterally, no wheezing, rhonchi or rales  Abd: soft, nontender, no hepatomegaly  Ext: no edema Musculoskeletal:  No deformities, BUE and BLE strength normal and equal Skin: warm and dry  Neuro:  CNs 2-12 intact, no focal abnormalities noted Psych:  Normal affect   EKG:  The EKG was personally reviewed and demonstrates:  SR, no acute changes Telemetry:  Telemetry was personally reviewed and demonstrates:  NSR  Relevant CV Studies: 07/31/2015 TTE Study Conclusions - Left ventricle: The cavity size was normal. There was mild   concentric hypertrophy. Systolic function was normal. The   estimated ejection fraction was in the range of 60% to 65%. Wall   motion was normal; there were no regional wall motion   abnormalities. Left ventricular diastolic function parameters   were normal. - Aortic root: The aortic root was mildly dilated, 3.8 cm - Left atrium: The atrium was mildly dilated. - Right ventricle: Systolic function was normal. - Pulmonary arteries: Systolic pressure was within the normal   range.  07/16/2015 Stress Test  No T wave inversion was noted during stress.  There was no ST segment deviation noted during stress. Good exercise capacity.  The study is normal.  This is a low risk study.  The left ventricular ejection fraction is normal (55-65%).   Laboratory Data:  Chemistry Recent Labs  Lab 04/14/18 2119 04/15/18 0030 04/15/18 0558  NA 140  --  142  K 3.6  --  3.9  CL 103  --  105  CO2 25  --  25  GLUCOSE 150*  --  102*  BUN 14  --  12  CREATININE 1.03 1.02 0.89  CALCIUM 9.3  --  9.0  GFRNONAA >60 >60 >60  GFRAA >60 >60 >60  ANIONGAP 12  --  12    Recent Labs  Lab  04/15/18 0558  PROT 6.6  ALBUMIN 3.7  AST 28  ALT 41  ALKPHOS 34*  BILITOT 0.6   Hematology Recent Labs  Lab 04/14/18 2119 04/15/18 0558  WBC 12.2* 7.7  RBC 4.59 4.52  HGB 14.3 14.2  HCT 41.3 40.6  MCV 90.0 89.8  MCH 31.2 31.4   MCHC 34.6 35.0  RDW 13.0 13.0  PLT 293 258   Cardiac Enzymes Recent Labs  Lab 04/14/18 2119 04/15/18 0030 04/15/18 0558  TROPONINI <0.03 0.08* 0.08*   No results for input(s): TROPIPOC in the last 168 hours.  BNPNo results for input(s): BNP, PROBNP in the last 168 hours.  DDimer No results for input(s): DDIMER in the last 168 hours.  Radiology/Studies:  Dg Chest 2 View  Result Date: 04/14/2018 CLINICAL DATA:  Chest pain. EXAM: CHEST - 2 VIEW COMPARISON:  07/11/2015 FINDINGS: The cardiomediastinal contours are normal, heart size upper normal. The lungs are clear. Pulmonary vasculature is normal. No consolidation, pleural effusion, or pneumothorax. No acute osseous abnormalities are seen. IMPRESSION: No acute findings. Electronically Signed   By: Narda Rutherford M.D.   On: 04/14/2018 22:25    Assessment and Plan:   Atypical angina with minimally elevated troponin  - No current chest pain.  Symptoms are somewhat atypical, lasting for several hours and occurring at rest and after eating dinner.  Patient reported 2 episodes of left-sided chest pressure that occurred at rest. Second episode resolved with aspirin.  No further episodes of chest pain since admission. Atypical chest pain is similar in nature to episodes described in 2017.  -Previous 2017 echo with normal EF.  Previous 2017 stress test ruled low risk. -Troponin initially <0.03 with subsequent troponin 0 0.082.  EKG without acute changes.  Low suspicion for cardiac etiology given previous work-up negative and atypical nature of chest pain episodes.  Of note, risk factors for cardiac etiology include previous history of smoking and hyperlipidemia.  Given atypical nature of chest pressure and flat trending / minimally elevated troponin, likely supply demand ischemia. -No plan for further or invasive ischemic work-up at this time. Plan for patient to ambulate several times around the floor.  If no chest pain with ambulation, will plan  for discharge home at this time with SL nitro in an attempt to reduce exposure during COVID-19 pandemic (and as this is the patient's preference after presented with options for further cardiac etiology, including stress test or cardiac cath). Advised to call [facility] cardiology if taking his third nitroglycerin tablet and still having CP. Future CAC score was also discussed. - Recommend patient follow-up with his primary cardiologist, Dr. Graciela Husbands, via virtual visit after discharge.  In the future, and with resolution of COVID-19 pandemic, would recommend updated stress test as an outpatient and if further episodes of chest pain/pressure.   History of RVOT PVCs - Followed by Dr. Graciela Husbands. Continue PTA PRN propanolol for PVCs.    For questions or updates, please contact [facility] Please consult [email or web address] for contact info under     Signed, Lennon Alstrom, PA-C  04/15/2018 11:41 AM

## 2018-04-15 NOTE — H&P (Addendum)
[employer] [facility] [place] [facility]   PATIENT NAME: Michael Fletcher    MR#:  [id number]  DATE OF BIRTH:  Feb 28, 1960  DATE OF ADMISSION:  04/14/2018  PRIMARY CARE PHYSICIAN: Titus Mould, NP   REQUESTING/REFERRING PHYSICIAN: Chiquita Loth, MD CHIEF COMPLAINT:   Chief Complaint  Patient presents with  . Chest Pain    HISTORY OF PRESENT ILLNESS:  Michael Fletcher  is a 58 y.o. male with a known history of dyslipidemia, BPH and allergic rhinitis who presented to the emergency room with acute onset of midsternal chest pain felt as pressure and graded initially 3-4/10 in severity and increasing to 9-01/08/08 in severity with radiation to his left arm without nausea vomiting or diaphoresis or palpitations.  He denies any dyspnea, orthopnea or paroxysmal nocturnal dyspnea.  No leg pain or edema or recent travels or surgeries.  He denied any cough or wheezing or shortness of breath.  No fever or chills.  No recent sick exposures or travel out of the state or out of the country.  Upon presentation to the emergency room, vital signs were within normal except for blood pressure 128/91.  Labs reveal potassium of 3.6 and his troponin I was less than 0.03.  CBC showed mild leukocytosis of 12.2.  EKG showed normal sinus rhythm with rate of 81 with slightly poor R wave progression.  PA and lateral chest ray showed no acute cardiopulmonary disease.  The patient's most recent 2D echo was not July 2017 and it revealed an EF of 60 to 65% with normal wall motion.  He had a normal myocardial perfusion study the same year.  A cardiac MRI that year revealed quantitative EF of 60% but showed right ventricular mild hypokinesis in the mid RV wall, possibly secondary to teetering from a prominent moderator band.  It was of normal size.  The patient was given aspirin by EMS and he was pain-free during his ER stay in my interview.  He will be admitted to an observation telemetry bed for further  evaluation and monitoring. PAST MEDICAL HISTORY:   Past Medical History:  Diagnosis Date  . Atypical chest pain    a. 07/2015 - ED visit.  . Chronic sinusitis   . Hx: meningitis   . Hyperlipidemia   . 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 10/2005 cMRI: EF 60%, mild basal and mid RV wall HK - possibly 2/2 tethering from prominent moderator band, no fatty infiltration making RV dysplasia less likely, nl atrial sizes;  c. 10/2010 Echo: EF 55-60%, no rwma, midlly dil Ao root.  Allergic rhinitis, BPH.  PAST SURGICAL HISTORY:   Past Surgical History:  Procedure Laterality Date  . NASAL SINUS SURGERY  01/2010    SOCIAL HISTORY:   Social History   Tobacco Use  . Smoking status: Former Smoker    Packs/day: 0.25    Years: 13.00    Pack years: 3.25    Types: Cigarettes  . Smokeless tobacco: Never Used  Substance Use Topics  . Alcohol use: Not Currently    Comment: occassional    FAMILY HISTORY:   Family History  Problem Relation Age of Onset  . Heart attack Father     DRUG ALLERGIES:   Allergies  Allergen Reactions  . Floxacillin Base Swelling    Swelling to hands  . Sulfur Swelling    Swelling to hands  . Statins     Makes him feel crazy.  REVIEW OF SYSTEMS:   ROS As per history of present illness. All pertinent systems were reviewed above. Constitutional,  HEENT, cardiovascular, respiratory, GI, GU, musculoskeletal, neuro, psychiatric, endocrine,  integumentary and hematologic systems were reviewed and are otherwise  negative/unremarkable except for positive findings mentioned above in the HPI.   MEDICATIONS AT HOME:   Prior to Admission medications   Medication Sig Start Date End Date Taking? Authorizing Provider  Multiple Vitamin (MULTIVITAMIN) tablet Take 1 tablet by mouth daily.   Yes [provider]  propranolol (INDERAL) 10 MG tablet Take 1 tablet (10 mg total) by mouth as needed. 04/16/15  Yes Duke Salvia,  MD  tamsulosin (FLOMAX) 0.4 MG CAPS capsule TK ONE C PO QD 1/2 HOUR AFTER A MEAL 08/21/15  Yes [provider]  fluticasone (FLONASE) 50 MCG/ACT nasal spray 2 sprays in each nostril twice a day 11/15/09   [provider]      VITAL SIGNS:  Blood pressure 122/78, pulse (!) 59, temperature 98.2 F (36.8 C), temperature source Oral, resp. rate 12, height 6\' 4"  (1.93 m), weight 122.5 kg, SpO2 96 %.  PHYSICAL EXAMINATION:  Physical Exam  GENERAL:  58 y.o.-year-old Caucasian male patient lying in the bed with no acute distress.  EYES: Pupils equal, round, reactive to light and accommodation. No scleral icterus. Extraocular muscles intact.  HEENT: Head atraumatic, normocephalic. Oropharynx and nasopharynx clear.  NECK:  Supple, no jugular venous distention. No thyroid enlargement, no tenderness.  LUNGS: Normal breath sounds bilaterally, no wheezing, rales,rhonchi or crepitation. No use of accessory muscles of respiration.  CARDIOVASCULAR: Regular rate and rhythm, S1, S2 normal. No murmurs, rubs, or gallops.  ABDOMEN: Soft, nontender, nondistended. Bowel sounds present. No organomegaly or mass.  EXTREMITIES: No pedal edema, cyanosis, or clubbing.  NEUROLOGIC: Cranial nerves II through XII are intact. Muscle strength 5/5 in all extremities. Sensation intact. Gait not checked.  PSYCHIATRIC: The patient is alert and oriented x 3.  SKIN: No obvious rash, lesion, or ulcer.   LABORATORY PANEL:   CBC Recent Labs  Lab 04/14/18 2119  WBC 12.2*  HGB 14.3  HCT 41.3  PLT 293   ------------------------------------------------------------------------------------------------------------------  Chemistries  Recent Labs  Lab 04/14/18 2119 04/15/18 0030  NA 140  --   K 3.6  --   CL 103  --   CO2 25  --   GLUCOSE 150*  --   BUN 14  --   CREATININE 1.03 1.02  CALCIUM 9.3  --     ------------------------------------------------------------------------------------------------------------------  Cardiac Enzymes Recent Labs  Lab 04/15/18 0030  TROPONINI 0.08*   ------------------------------------------------------------------------------------------------------------------  RADIOLOGY:  Dg Chest 2 View  Result Date: 04/14/2018 CLINICAL DATA:  Chest pain. EXAM: CHEST - 2 VIEW COMPARISON:  07/11/2015 FINDINGS: The cardiomediastinal contours are normal, heart size upper normal. The lungs are clear. Pulmonary vasculature is normal. No consolidation, pleural effusion, or pneumothorax. No acute osseous abnormalities are seen. IMPRESSION: No acute findings. Electronically Signed   By: Narda Rutherford M.D.   On: 04/14/2018 22:25      IMPRESSION AND PLAN:   #1.  Chest pain,  rule out acute coronary syndrome.  The patient will be admitted to an observation telemetry bed.  Will follow serial cardiac enzymes and EKGs.  We will obtain a cardiology consult in a.m. for further cardiac risk stratification.  The patient will be placed on aspirin as well as p.r.n. sublingual nitroglycerin and morphine sulfate for pain.  Dr. Elberta Fortis was contacted about the patient.  2.  Dyslipidemia.  This is apparently diet managed.  Fasting lipids will be obtained this a.m.  3.  BPH.  We will continue Flomax.  4.  Allergic rhinitis.  Continue Flonase nasal spray.  5.  History of arrhythmia, likely  PVCs for which he has been taking as needed propranolol.  He is not taking it since October.  Will monitor on telemetry for any arrhythmias.  6.  DVT prophylaxis.  This will be provided with subcutaneous Lovenox   All the records are reviewed and case discussed with ED provider. Management plans discussed with the patient and he is  in agreement.  CODE STATUS: Full  TOTAL TIME TAKING CARE OF THIS PATIENT: 55 minutes.    Hannah BeatJan A Denise Bramblett M.D on 04/15/2018 at 1:45 AM  Pager - [phone]  After  6pm go to [email or web address] - Scientist, research (life sciences)password [email or web address]  [facility]  Office  [phone]  CC: Primary care physician; White, Arlyss RepressElizabeth Burney, NP   Note: This dictation was prepared with Dragon dictation along with smaller phrase technology. Any transcriptional errors that result from this process are unintentional.

## 2018-04-26 ENCOUNTER — Telehealth: Payer: BLUE CROSS/BLUE SHIELD | Admitting: Physician Assistant

## 2018-04-29 ENCOUNTER — Other Ambulatory Visit: Payer: Self-pay

## 2018-04-29 ENCOUNTER — Telehealth (INDEPENDENT_AMBULATORY_CARE_PROVIDER_SITE_OTHER): Payer: BLUE CROSS/BLUE SHIELD | Admitting: Internal Medicine

## 2018-04-29 VITALS — BP 124/84 | HR 62 | Ht 76.0 in | Wt 270.0 lb

## 2018-04-29 DIAGNOSIS — R42 Dizziness and giddiness: Secondary | ICD-10-CM

## 2018-04-29 DIAGNOSIS — I493 Ventricular premature depolarization: Secondary | ICD-10-CM

## 2018-04-29 DIAGNOSIS — E669 Obesity, unspecified: Secondary | ICD-10-CM

## 2018-04-29 NOTE — Progress Notes (Signed)
Electrophysiology TeleHealth Note   Due to national recommendations of social distancing due to COVID 19, an audio/video telehealth visit is felt to be most appropriate for this patient at this time.  See MyChart message from today for the patient's consent to telehealth for [facility] [place].   Date:  04/29/2018   ID:  Michael Fletcher, DOB 15-Jan-1960, MRN [id number]  Location: patient's home  Provider location: [address], [place]  Evaluation Performed: Follow-up visit  PCP:  Titus Mould, NP  Cardiologist:   *no  Electrophysiologist:  SK   Chief Complaint: PVCs History of Present Illness:    Michael Fletcher is a 58 y.o. male who presents via audio/video conferencing for a telehealth visit today.  Since last being seen in our clinic 2017 for  RVOT-PVCs   the patient reports doing quite well.  No lightheadedness.  For the most part, PVCs are quiescient-- aggravated by soda  Exercising a little bit.  Weight remains a problem.  No chest pain  or edema.  Some shortness of breath with moderate exertion    2012 MRI mild hypokinesis  7/17 stress myoview-normal capacity and no ischemia;   7/17 Echo  Normal LV function and mild LVH The patient denies symptoms of fevers, chills, cough, or new SOB worrisome for COVID 19.   Past Medical History:  Diagnosis Date  . Atypical chest pain    a. 07/2015 - ED visit.  . Chronic sinusitis   . Hx: meningitis   . Hyperlipidemia   . 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 10/2005 cMRI: EF 60%, mild basal and mid RV wall HK - possibly 2/2 tethering from prominent moderator band, no fatty infiltration making RV dysplasia less likely, nl atrial sizes;  c. 10/2010 Echo: EF 55-60%, no rwma, midlly dil Ao root.    Past Surgical History:  Procedure Laterality Date  . NASAL SINUS SURGERY  01/2010    Current Outpatient Medications  Medication Sig Dispense Refill  . EPINEPHrine 0.3 mg/0.3 mL  IJ SOAJ injection INJECT IM PRF ANAPHYLAXIS UTD FOR UP TO 1 DOSE    . fluticasone (FLONASE) 50 MCG/ACT nasal spray 2 sprays in each nostril twice a day    . Multiple Vitamin (MULTIVITAMIN) tablet Take 1 tablet by mouth daily.    . nitroGLYCERIN (NITROSTAT) 0.4 MG SL tablet Place 1 tablet (0.4 mg total) under the tongue every 5 (five) minutes as needed for chest pain. 30 tablet 0  . propranolol (INDERAL) 10 MG tablet Take 1 tablet (10 mg total) by mouth as needed. 90 tablet 0  . tamsulosin (FLOMAX) 0.4 MG CAPS capsule TK ONE C PO QD 1/2 HOUR AFTER A MEAL  2   No current facility-administered medications for this visit.     Allergies:   Floxacillin base; Sulfur; and Statins   Social History:  The patient  reports that he has quit smoking. His smoking use included cigarettes. He has a 3.25 pack-year smoking history. He has never used smokeless tobacco. He reports previous alcohol use. He reports that he does not use drugs.   Family History:  The patient's   family history includes Heart attack in his father.   ROS:  Please see the history of present illness.   All other systems are personally reviewed and negative.    Exam:    Vital Signs:  BP 124/84 (BP Location: Left Arm, Patient Position: Sitting, Cuff Size: Normal)  Pulse 62   Ht 6\' 4"  (1.93 m)   Wt 270 lb (122.5 kg)   BMI 32.87 kg/m     Well appearing, alert and conversant, regular work of breathing,  good skin color Eyes- anicteric, neuro- grossly intact, skin- no apparent rash or lesions or cyanosis, mouth- oral mucosa is pink   Labs/Other Tests and Data Reviewed:    Recent Labs: 04/15/2018: ALT 41; BUN 12; Creatinine, Ser 0.89; Hemoglobin 14.2; Platelets 258; Potassium 3.9; Sodium 142   Wt Readings from Last 3 Encounters:  04/29/18 270 lb (122.5 kg)  04/14/18 270 lb (122.5 kg)  09/27/15 269 lb 8 oz (122.2 kg)     Other studies personally reviewed: Additional studies/ records that were reviewed today include:       ASSESSMENT & PLAN:    PVCs-RVOT  Lightheadedness  Obesity   PVCs and lightheadedness currently seem to be quiescient.  Lengthy discussion regarding importance of exercise and weight loss.  Encouraged him to use COVID bonus time to set new goals and to implement new habits   COVID 19 screen The patient denies symptoms of COVID 19 at this time.  The importance of social distancing was discussed today.  Follow-up:  3162m H can we call him in 3 months and aks him his weight     Current medicines are reviewed at length with the patient today.   The patient does not have concerns regarding his medicines.  The following changes were made today:  none  Labs/ tests ordered today include:   No orders of the defined types were placed in this encounter.   Future tests ( post COVID )     Patient Risk:  after full review of this patients clinical status, I feel that they are at mild  risk at this time.  Today, I have spent 13 minutes with the patient with telehealth technology discussing the above.  Signed, Sherryl MangesSteven , MD  04/29/2018 10:05 AM     [facility] [address] [place] [address] [phone] (office) [phone] (fax)

## 2018-04-29 NOTE — Patient Instructions (Signed)
Medication Instructions:  - Your physician recommends that you continue on your current medications as directed. Please refer to the Current Medication list given to you today.  If you need a refill on your cardiac medications before your next appointment, please call your pharmacy.   Lab work: - none ordered  If you have labs (blood work) drawn today and your tests are completely normal, you will receive your results only by: Marland Kitchen MyChart Message (if you have MyChart) OR . A paper copy in the mail If you have any lab test that is abnormal or we need to change your treatment, we will call you to review the results.  Testing/Procedures: - none ordered  Follow-Up: At [facility], you and your health needs are our priority.  As part of our continuing mission to provide you with exceptional heart care, we have created designated Provider Care Teams.  These Care Teams include your primary Cardiologist (physician) and Advanced Practice Providers (APPs -  Physician Assistants and Nurse Practitioners) who all work together to provide you with the care you need, when you need it. . You will need a follow up appointment in 1 year with Dr. Graciela Husbands.   Marland Kitchen Please call our office 2 months in advance to schedule this appointment.  (call in early February to schedule)  Any Other Special Instructions Will Be Listed Below (If Applicable). - Dr. Odessa Fleming nurse, Herbert Seta, will call you in 3 months to follow up on how your weight is doing

## 2018-07-26 ENCOUNTER — Telehealth: Payer: Self-pay | Admitting: *Deleted

## 2018-07-26 NOTE — Telephone Encounter (Signed)
I called and spoke with the patient regarding his weight loss. He states that his weight is down 15 lbs since his appointment with Dr. Caryl Comes. I congratulated the patient and advised I would notify Dr. Caryl Comes.

## 2018-07-26 NOTE — Telephone Encounter (Signed)
-----   Message from Emily Filbert, RN sent at 04/29/2018 10:20 AM EDT ----- Call to check on his weights per telemedicine visit- 04/29/18 with Dr. Caryl Comes.

## 2018-07-27 NOTE — Telephone Encounter (Signed)
thnks  For rmmbring

## 2019-06-30 ENCOUNTER — Ambulatory Visit (INDEPENDENT_AMBULATORY_CARE_PROVIDER_SITE_OTHER): Payer: BC Managed Care – PPO | Admitting: Internal Medicine

## 2019-06-30 ENCOUNTER — Encounter: Payer: Self-pay | Admitting: Internal Medicine

## 2019-06-30 ENCOUNTER — Other Ambulatory Visit: Payer: Self-pay

## 2019-06-30 ENCOUNTER — Telehealth: Payer: Self-pay | Admitting: Internal Medicine

## 2019-06-30 VITALS — BP 128/80 | HR 57 | Ht 76.0 in | Wt 267.1 lb

## 2019-06-30 DIAGNOSIS — I493 Ventricular premature depolarization: Secondary | ICD-10-CM

## 2019-06-30 DIAGNOSIS — R42 Dizziness and giddiness: Secondary | ICD-10-CM | POA: Diagnosis not present

## 2019-06-30 MED ORDER — PROPRANOLOL HCL 10 MG PO TABS: 10.0000 mg | ORAL_TABLET | ORAL | 1 refills | Status: DC | PRN

## 2019-06-30 NOTE — Telephone Encounter (Signed)
Secure chat message received from Dr. Graciela Husbands.  Paul good morning steve klein hope you are well i saw this patient today who was complaining of pulsatile tinnitus, which I had been told years ago to dismiss. I was then reading on UpToDate and they quoted a 42 % significant ( and potentially life threatening ) pathology rate and recommended eval by otolaryngology or neurootology. Was wondering whom you might recommend to help with this patients evaluation-- i dont know where to begin; UptoDate outlines extensive imaging and evaluation thanks for your help

## 2019-06-30 NOTE — Progress Notes (Signed)
Patient Care Team: Titus Mould, NP as PCP - General (Family Medicine) Duke Salvia, MD as PCP - Cardiology (Cardiology) Duke Salvia, MD as PCP - Electrophysiology (Cardiology)   HPI  Michael Fletcher is a 59 y.o. male Seen in followup for PVCs with RVOT morphology. MRI 2007  RV mild hypokinesis but w/o fatty infiltration. Initially in 2007 thenagain in 2012 and 2017    We have no documentation in the current computer systems of his PVC morphology.  Scant palps  The patient denies chest pain, shortness of breath, nocturnal dyspnea, orthopnea or peripheral edema.  There have been no palpitations, lightheadedness or syncope.   Worried about his slow HR-- with exercise, HR>>130s'  C/o pulsatile tinnitus    DATE TEST EF   10/07 cMRI 60% Mild RV hypokinesis  7/17 Myoview   * % No ischemia  7/17 Echo   55-65 %          Date Cr K Hgb  4/20 0.89 3.9 14.2       Past Medical History:  Diagnosis Date  . Atypical chest pain    a. 07/2015 - ED visit.  . Chronic sinusitis   . Hx: meningitis   . Hyperlipidemia   . 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 10/2005 cMRI: EF 60%, mild basal and mid RV wall HK - possibly 2/2 tethering from prominent moderator band, no fatty infiltration making RV dysplasia less likely, nl atrial sizes;  c. 10/2010 Echo: EF 55-60%, no rwma, midlly dil Ao root.    Past Surgical History:  Procedure Laterality Date  . NASAL SINUS SURGERY  01/2010    Current Outpatient Medications  Medication Sig Dispense Refill  . EPINEPHrine 0.3 mg/0.3 mL IJ SOAJ injection INJECT IM PRF ANAPHYLAXIS UTD FOR UP TO 1 DOSE    . fluticasone (FLONASE) 50 MCG/ACT nasal spray 2 sprays in each nostril twice a day    . Multiple Vitamin (MULTIVITAMIN) tablet Take 1 tablet by mouth daily.    . nitroGLYCERIN (NITROSTAT) 0.4 MG SL tablet Place 1 tablet (0.4 mg total) under the tongue every 5 (five) minutes as needed for chest pain. 30  tablet 0  . propranolol (INDERAL) 10 MG tablet Take 1 tablet (10 mg total) by mouth as needed. 90 tablet 0  . tamsulosin (FLOMAX) 0.4 MG CAPS capsule Take 0.4 mg by mouth daily.     No current facility-administered medications for this visit.    Allergies  Allergen Reactions  . Flucloxacillin Other (See Comments) and Swelling    Swelling to hands  . Floxacillin Base Swelling    Swelling to hands  . Sulfur Swelling    Swelling to hands  . Statins     Makes him feel crazy.  . Other Rash    Makes him feel crazy. Other Reaction: SWELLING OF THE HANDS    Review of Systems negative except from HPI and PMH  Physical Exam BP 128/80 (BP Location: Left Arm, Patient Position: Sitting, Cuff Size: Normal)   Pulse (!) 57   Ht 6\' 4"  (1.93 m)   Wt 267 lb 2 oz (121.2 kg)   SpO2 98%   BMI 32.52 kg/m  Well developed and nourished in no acute distress HENT normal Neck supple with JVP-flat Clear Regular rate and rhythm, no murmurs or gallops Abd-soft with active BS No Clubbing cyanosis edema Skin-warm and dry A & Oriented  Grossly normal sensory and motor function y ECG  7/17 was reviewed No changes noted compared to 2 years ago  Assessment and  Plan  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

## 2019-06-30 NOTE — Telephone Encounter (Signed)
Secure chat response from Dr. Metta Clines.  Our practice will see him, check his hearing, and then order the appropriate MR imaging studies if you wish. This does not need to be done while in the hospital. I'm not sure if he is seeing you as in or out patient. If he's in [place] he can see one of my partners there. My office is the [place] satellite if he is closer out here. [place]: [phone] Men: [phone]. Thanks.

## 2019-06-30 NOTE — Patient Instructions (Signed)

## 2019-07-12 NOTE — Telephone Encounter (Signed)
I called and spoke with the patient and advised him that Dr. Metta Clines is willing to see him for his pulsatile tinnitus. He has been given the number to the [facility] office, but was aware that one of Dr. Mitzie Na partners could see him in [place] if he desired that location.  The patient states he was fine with calling the [place] office.

## 2019-11-04 IMAGING — CR CHEST - 2 VIEW
2 series · 2 of 2 positions shown · non-contrast
Comparison: 07/11/2015

CLINICAL DATA: Chest pain.

EXAM:
CHEST - 2 VIEW

[chest pa]
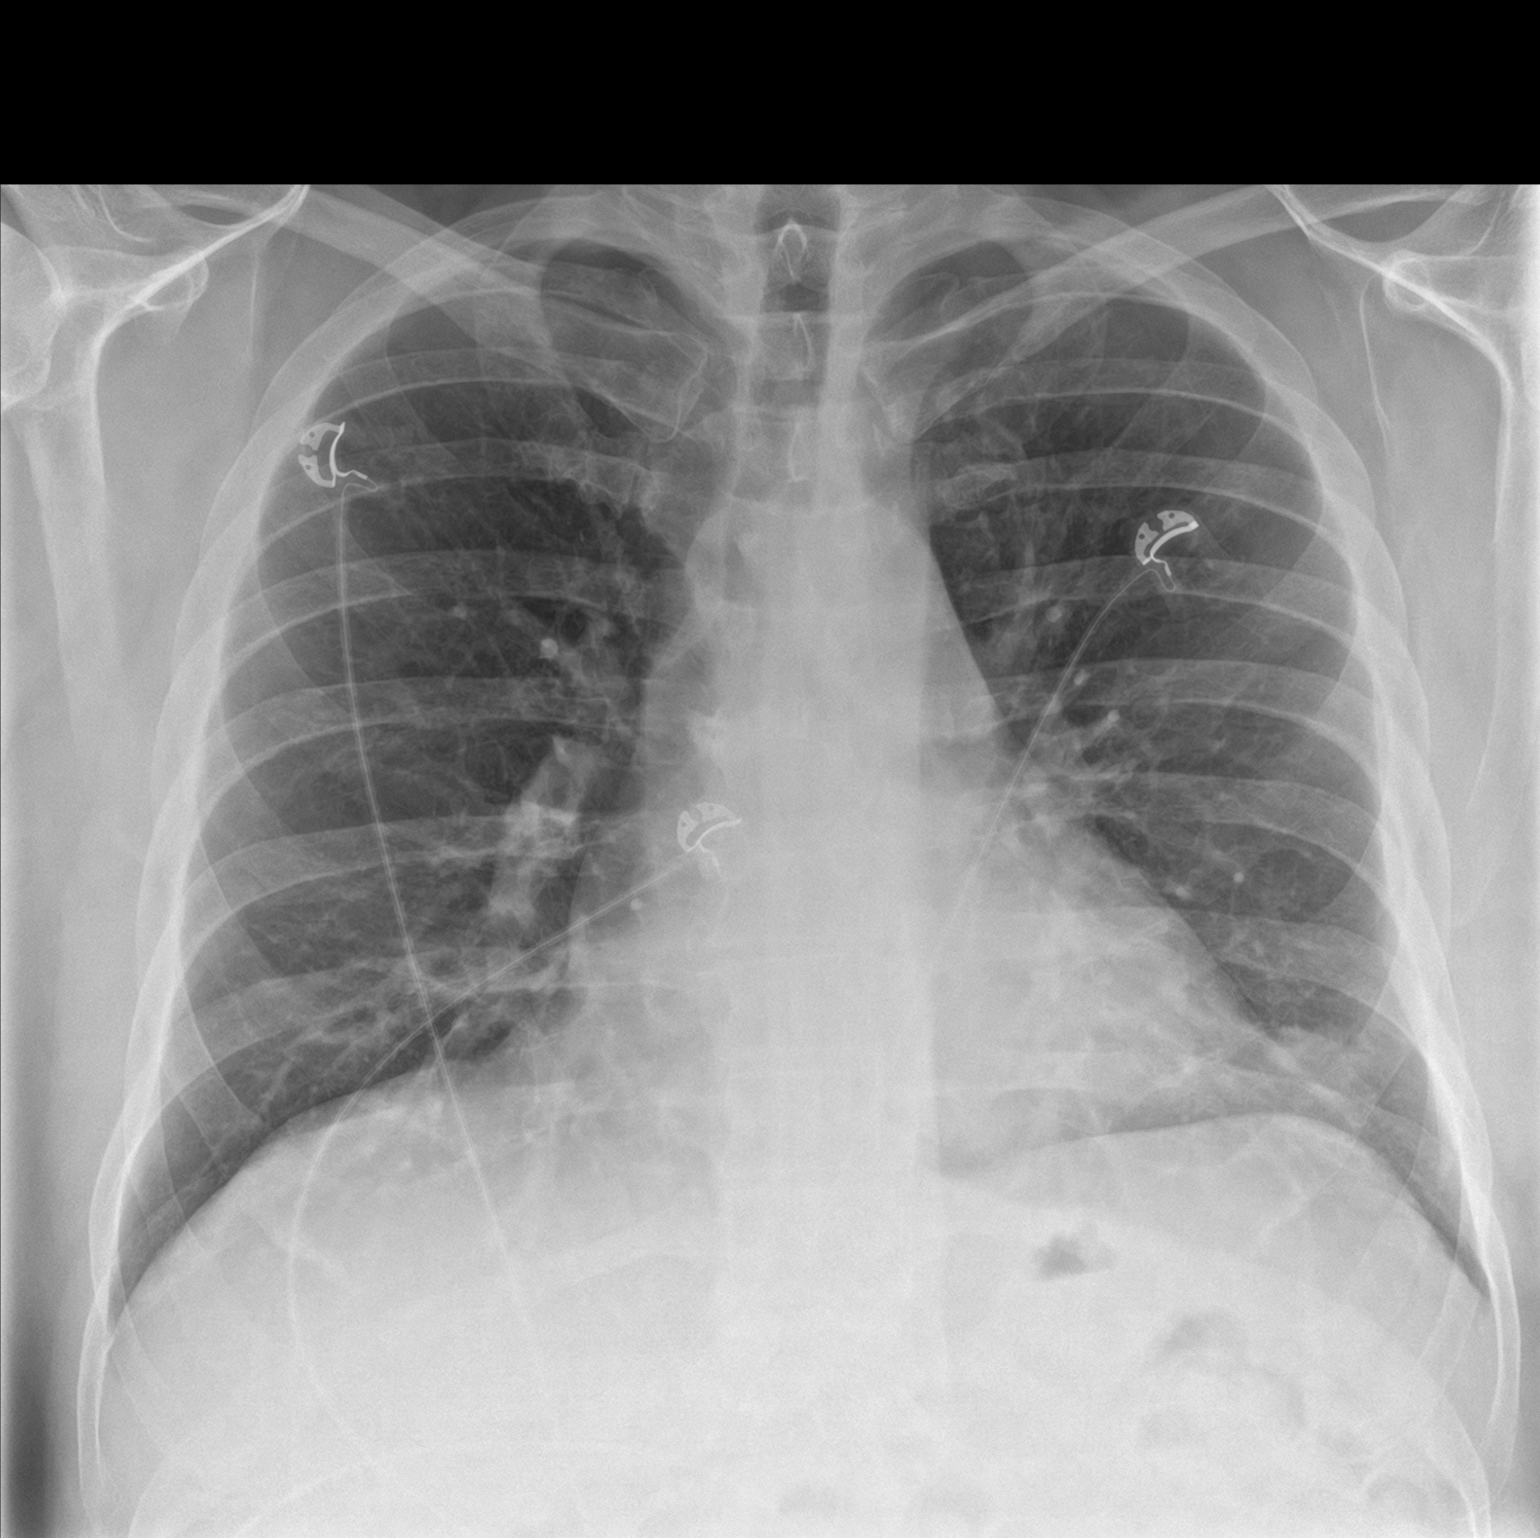

[chest lat]
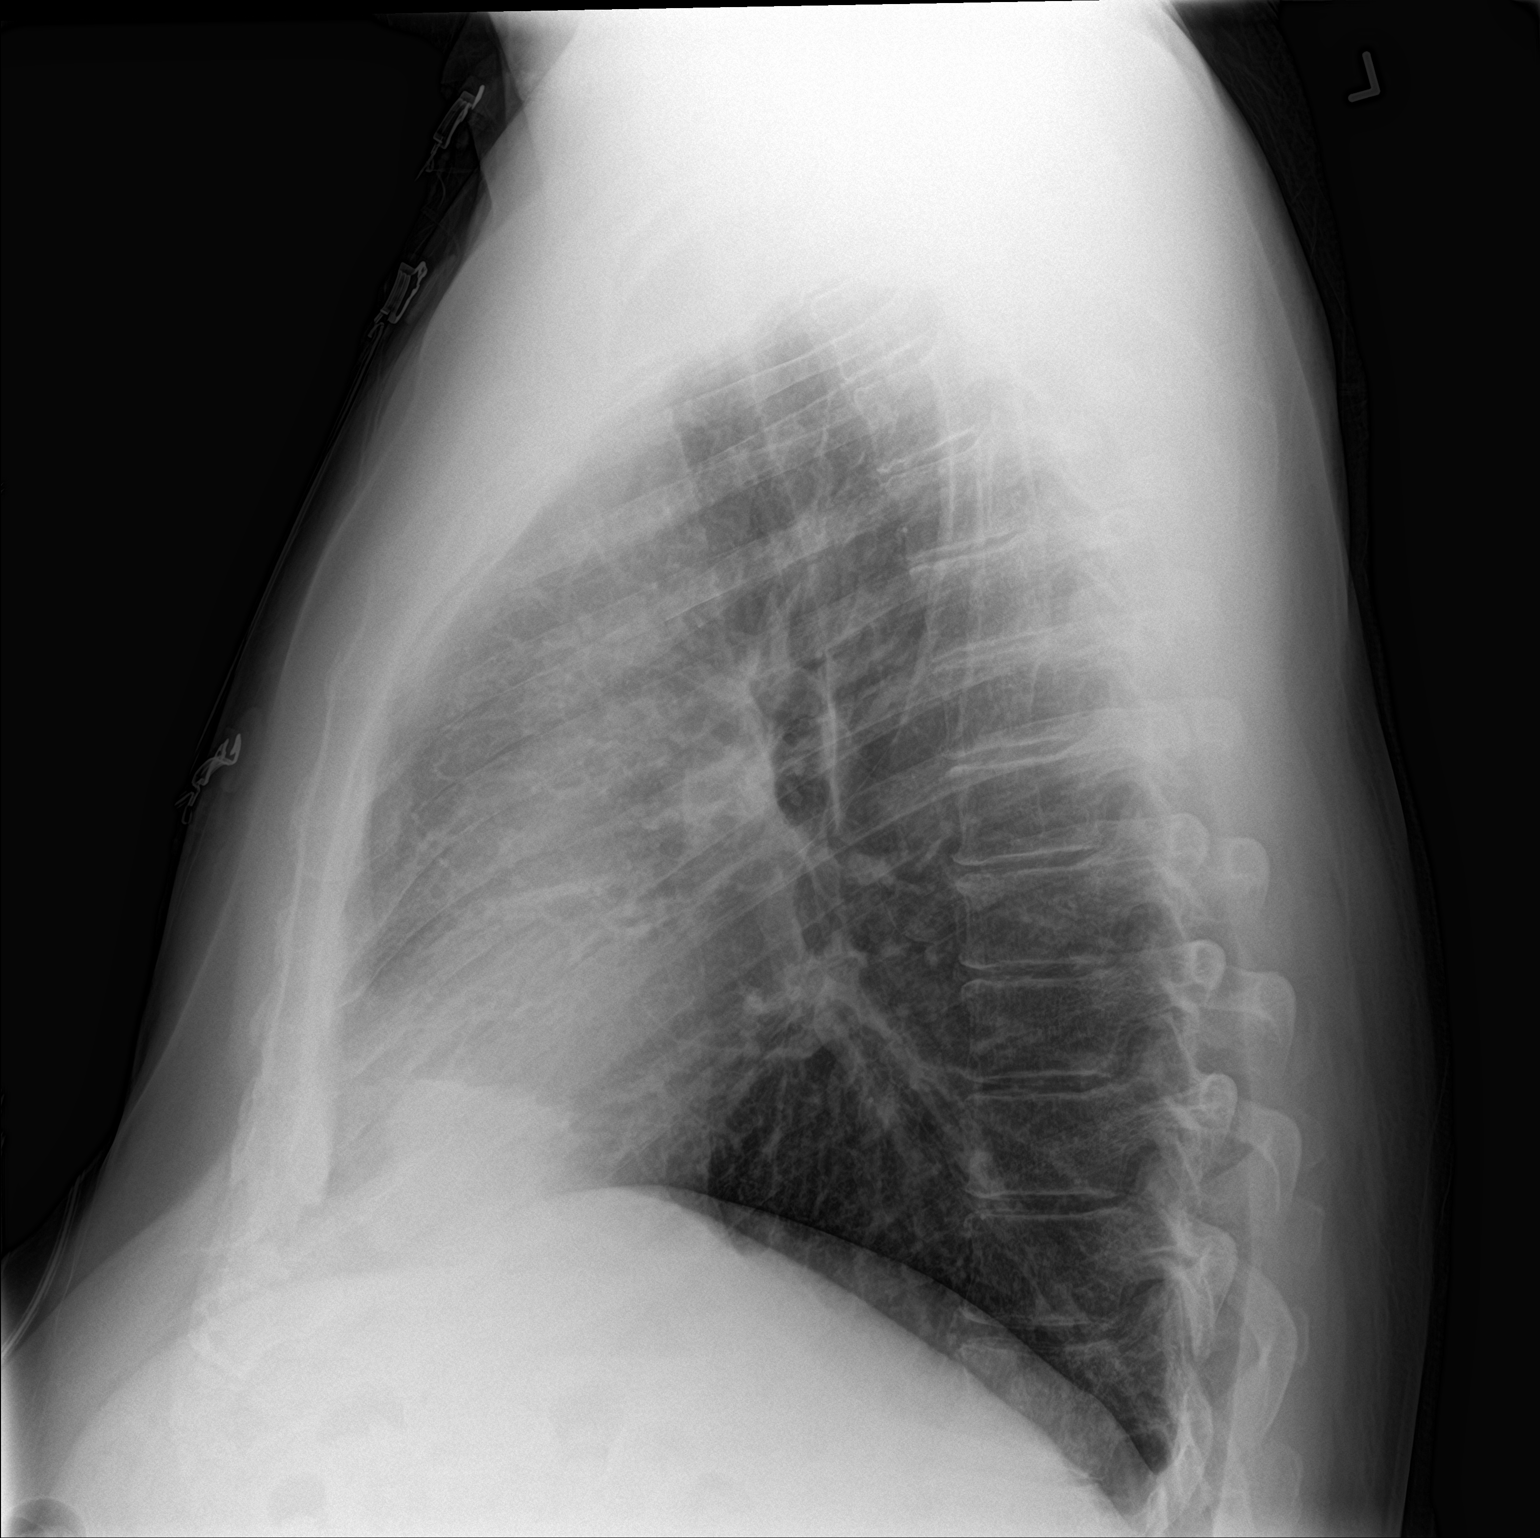

[2 of 2 positions shown; findings below may reference images not displayed]

FINDINGS: The cardiomediastinal contours are normal, heart size upper normal.
The lungs are clear. Pulmonary vasculature is normal. No
consolidation, pleural effusion, or pneumothorax. No acute osseous
abnormalities are seen.
IMPRESSION: No acute findings.

## 2020-01-02 ENCOUNTER — Other Ambulatory Visit: Payer: Self-pay | Admitting: Internal Medicine

## 2021-07-11 ENCOUNTER — Ambulatory Visit: Payer: BLUE CROSS/BLUE SHIELD | Admitting: Internal Medicine

## 2021-07-11 ENCOUNTER — Encounter: Payer: Self-pay | Admitting: Internal Medicine

## 2021-07-11 VITALS — BP 115/82 | HR 71 | Ht 76.0 in | Wt 268.4 lb

## 2021-07-11 DIAGNOSIS — I493 Ventricular premature depolarization: Secondary | ICD-10-CM | POA: Diagnosis not present

## 2021-07-11 NOTE — Patient Instructions (Signed)
Medication Instructions:  Your physician recommends that you continue on your current medications as directed. Please refer to the Current Medication list given to you today.  *If you need a refill on your cardiac medications before your next appointment, please call your pharmacy*   Lab Work: None ordered.  If you have labs (blood work) drawn today and your tests are completely normal, you will receive your results only by: MyChart Message (if you have MyChart) OR A paper copy in the mail If you have any lab test that is abnormal or we need to change your treatment, we will call you to review the results.   Testing/Procedures: None ordered.    Follow-Up: At [facility], you and your health needs are our priority.  As part of our continuing mission to provide you with exceptional heart care, we have created designated Provider Care Teams.  These Care Teams include your primary Cardiologist (physician) and Advanced Practice Providers (APPs -  Physician Assistants and Nurse Practitioners) who all work together to provide you with the care you need, when you need it.  We recommend signing up for the patient portal called "MyChart".  Sign up information is provided on this After Visit Summary.  MyChart is used to connect with patients for Virtual Visits (Telemedicine).  Patients are able to view lab/test results, encounter notes, upcoming appointments, etc.  Non-urgent messages can be sent to your provider as well.   To learn more about what you can do with MyChart, go to [email or web address].    Your next appointment:   Follow up with Dr Graciela Husbands in 2 years  Important Information About Sugar

## 2021-07-11 NOTE — Progress Notes (Signed)
Patient Care Team: Titus Mould, NP as PCP - General (Family Medicine) Duke Salvia, MD as PCP - Cardiology (Cardiology) Duke Salvia, MD as PCP - Electrophysiology (Cardiology)   HPI  Michael Fletcher is a 61 y.o. male Seen in followup for PVCs with RVOT morphology. MRI 2007  RV mild hypokinesis but w/o fatty infiltration. Last seen 6/21  We have no documentation in the current computer systems of his PVC morphology.   The patient denies chest pain, shortness of breath, nocturnal dyspnea, orthopnea or peripheral edema.  There have been no palpitations, lightheadedness or syncope.    DATE TEST EF   10/07 cMRI 60% Mild RV hypokinesis  7/17 Myoview   * % No ischemia  7/17 Echo   55-65 %          Date Cr K Hgb  4/20 0.89 3.9 14.2  4/22 0.97 4.0 15.3       Past Medical History:  Diagnosis Date   Atypical chest pain    a. 07/2015 - ED visit.   Chronic sinusitis    Hx: meningitis    Hyperlipidemia    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 10/2005 cMRI: EF 60%, mild basal and mid RV wall HK - possibly 2/2 tethering from prominent moderator band, no fatty infiltration making RV dysplasia less likely, nl atrial sizes;  c. 10/2010 Echo: EF 55-60%, no rwma, midlly dil Ao root.    Past Surgical History:  Procedure Laterality Date   NASAL SINUS SURGERY  01/2010    Current Outpatient Medications  Medication Sig Dispense Refill   EPINEPHrine 0.3 mg/0.3 mL IJ SOAJ injection INJECT IM PRF ANAPHYLAXIS UTD FOR UP TO 1 DOSE     fluticasone (FLONASE) 50 MCG/ACT nasal spray 2 sprays in each nostril twice a day     Multiple Vitamin (MULTIVITAMIN) tablet Take 1 tablet by mouth daily.     nitroGLYCERIN (NITROSTAT) 0.4 MG SL tablet Place 1 tablet (0.4 mg total) under the tongue every 5 (five) minutes as needed for chest pain. 30 tablet 0   propranolol (INDERAL) 10 MG tablet TAKE 1 TABLET BY MOUTH AS NEEDED 90 tablet 1   tamsulosin (FLOMAX) 0.4 MG  CAPS capsule Take 0.4 mg by mouth daily.     No current facility-administered medications for this visit.    Allergies  Allergen Reactions   Floxacillin (Flucloxacillin) Other (See Comments) and Swelling    Swelling to hands   Elemental Sulfur Swelling    Swelling to hands   Floxacillin Base Swelling    Swelling to hands   Statins     Makes him feel crazy.   Other Rash    Makes him feel crazy. Other Reaction: SWELLING OF THE HANDS    Review of Systems negative except from HPI and PMH  Physical Exam BP 115/82   Pulse 71   Ht 6\' 4"  (1.93 m)   Wt 268 lb 6.4 oz (121.7 kg)   SpO2 97%   BMI 32.67 kg/m  Well developed and nourished in no acute distress HENT normal Neck supple with JVP-  flat   Clear Regular rate and rhythm, no murmurs or gallops Abd-soft with active BS No Clubbing cyanosis edema Skin-warm and dry A & Oriented  Grossly normal sensory and motor function  ECG sinus @ 71 19/09/36    ECG  7/17 was reviewed No changes noted compared to 2 years ago  Assessment and  Plan  Cardiomyopathy-mild  with terminal late activation in V1  PVCs    Scant palpitations.  No evidence of progressive QRS widening.  We will continue to follow.  Inderal 20 mg as needed.
